# Patient Record
Sex: Female | Born: 1995 | Hispanic: Yes | Marital: Married | Smoking: Former smoker
Health system: Southern US, Community
[De-identification: ages and names within clinical notes are randomized; demographics above are authoritative.]

## PROBLEM LIST (undated history)

## (undated) DIAGNOSIS — N159 Renal tubulo-interstitial disease, unspecified: Secondary | ICD-10-CM

## (undated) DIAGNOSIS — Z789 Other specified health status: Secondary | ICD-10-CM

## (undated) HISTORY — DX: Renal tubulo-interstitial disease, unspecified: N15.9

## (undated) HISTORY — PX: OTHER SURGICAL HISTORY: SHX169

## (undated) HISTORY — PX: NO PAST SURGERIES: SHX2092

---
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
## 2016-10-25 ENCOUNTER — Emergency Department
Admission: EM | Admit: 2016-10-25 | Discharge: 2016-10-25 | Disposition: A | Discharge status: Home / Self Care | Payer: No Typology Code available for payment source | Attending: Emergency Medicine | Admitting: Emergency Medicine

## 2016-10-25 ENCOUNTER — Emergency Department: Payer: No Typology Code available for payment source

## 2016-10-25 DIAGNOSIS — S161XXA Strain of muscle, fascia and tendon at neck level, initial encounter: Secondary | ICD-10-CM | POA: Insufficient documentation

## 2016-10-25 DIAGNOSIS — Y939 Activity, unspecified: Secondary | ICD-10-CM | POA: Diagnosis not present

## 2016-10-25 DIAGNOSIS — Y929 Unspecified place or not applicable: Secondary | ICD-10-CM | POA: Diagnosis not present

## 2016-10-25 DIAGNOSIS — S7010XA Contusion of unspecified thigh, initial encounter: Secondary | ICD-10-CM

## 2016-10-25 DIAGNOSIS — M25552 Pain in left hip: Secondary | ICD-10-CM | POA: Insufficient documentation

## 2016-10-25 DIAGNOSIS — M25551 Pain in right hip: Secondary | ICD-10-CM | POA: Diagnosis not present

## 2016-10-25 DIAGNOSIS — S79919A Unspecified injury of unspecified hip, initial encounter: Secondary | ICD-10-CM | POA: Diagnosis present

## 2016-10-25 DIAGNOSIS — Y999 Unspecified external cause status: Secondary | ICD-10-CM | POA: Diagnosis not present

## 2016-10-25 DIAGNOSIS — S7000XA Contusion of unspecified hip, initial encounter: Secondary | ICD-10-CM

## 2016-10-25 LAB — POCT PREGNANCY, URINE: Preg Test, Ur: NEGATIVE

## 2016-10-25 MED ORDER — OXYCODONE-ACETAMINOPHEN 5-325 MG PO TABS: 1.0000 | ORAL_TABLET | Freq: Four times a day (QID) | ORAL | 0 refills | Status: AC | PRN

## 2016-10-25 MED ORDER — MORPHINE SULFATE (PF) 4 MG/ML IV SOLN
4.0000 mg | Freq: Once | INTRAVENOUS | Status: AC
  Administered 2016-10-25: 4 mg via INTRAVENOUS
  Filled 2016-10-25: qty 1

## 2016-10-25 MED ORDER — CYCLOBENZAPRINE HCL 5 MG PO TABS: 5.0000 mg | ORAL_TABLET | Freq: Three times a day (TID) | ORAL | 0 refills | Status: DC | PRN

## 2016-10-25 MED ORDER — ONDANSETRON HCL 4 MG/2ML IJ SOLN
INTRAMUSCULAR | Status: AC
  Administered 2016-10-25: 4 mg via INTRAVENOUS
  Filled 2016-10-25: qty 2

## 2016-10-25 MED ORDER — ONDANSETRON HCL 4 MG/2ML IJ SOLN
4.0000 mg | Freq: Once | INTRAMUSCULAR | Status: AC
  Administered 2016-10-25: 4 mg via INTRAVENOUS
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
## 2016-10-25 NOTE — Discharge Instructions (Addendum)
Take medication as prescribed. Return to emergency department if symptoms worsen and follow-up with PCP as needed.    As symptoms improve you may transition to over-the-counter ibuprofen, Advil, Motrin or Aleve as directed on the medication bottle. You may also use cold or heat for symptom management.

## 2017-01-09 ENCOUNTER — Emergency Department: Payer: Self-pay

## 2017-01-09 DIAGNOSIS — Z3A01 Less than 8 weeks gestation of pregnancy: Secondary | ICD-10-CM | POA: Insufficient documentation

## 2017-01-09 DIAGNOSIS — R1031 Right lower quadrant pain: Secondary | ICD-10-CM | POA: Insufficient documentation

## 2017-01-09 DIAGNOSIS — O9989 Other specified diseases and conditions complicating pregnancy, childbirth and the puerperium: Secondary | ICD-10-CM | POA: Insufficient documentation

## 2017-01-09 LAB — URINALYSIS, COMPLETE (UACMP) WITH MICROSCOPIC
Bilirubin Urine: NEGATIVE
GLUCOSE, UA: NEGATIVE mg/dL
HGB URINE DIPSTICK: NEGATIVE
Ketones, ur: NEGATIVE mg/dL
Leukocytes, UA: NEGATIVE
Nitrite: NEGATIVE
Protein, ur: NEGATIVE mg/dL
SPECIFIC GRAVITY, URINE: 1.002 — AB (ref 1.005–1.030)
pH: 6 (ref 5.0–8.0)

## 2017-01-09 LAB — CBC
HEMATOCRIT: 35 % (ref 35.0–47.0)
HEMOGLOBIN: 12 g/dL (ref 12.0–16.0)
MCH: 31.2 pg (ref 26.0–34.0)
MCHC: 34.1 g/dL (ref 32.0–36.0)
MCV: 91.4 fL (ref 80.0–100.0)
Platelets: 279 10*3/uL (ref 150–440)
RBC: 3.83 MIL/uL (ref 3.80–5.20)
RDW: 12.3 % (ref 11.5–14.5)
WBC: 7.4 10*3/uL (ref 3.6–11.0)

## 2017-01-09 LAB — COMPREHENSIVE METABOLIC PANEL
ALK PHOS: 57 U/L (ref 38–126)
ALT: 23 U/L (ref 14–54)
ANION GAP: 7 (ref 5–15)
AST: 22 U/L (ref 15–41)
Albumin: 4.3 g/dL (ref 3.5–5.0)
BILIRUBIN TOTAL: 0.7 mg/dL (ref 0.3–1.2)
BUN: 9 mg/dL (ref 6–20)
CALCIUM: 9.3 mg/dL (ref 8.9–10.3)
CO2: 24 mmol/L (ref 22–32)
Chloride: 103 mmol/L (ref 101–111)
Creatinine, Ser: 0.46 mg/dL (ref 0.44–1.00)
GFR calc Af Amer: >60 mL/min (ref >60)
GFR calc non Af Amer: >60 mL/min (ref >60)
Glucose, Bld: 88 mg/dL (ref 65–99)
POTASSIUM: 4.1 mmol/L (ref 3.5–5.1)
SODIUM: 134 mmol/L — AB (ref 135–145)
TOTAL PROTEIN: 7.5 g/dL (ref 6.5–8.1)

## 2017-01-09 LAB — HCG, QUANTITATIVE, PREGNANCY: HCG, BETA CHAIN, QUANT, S: 7075 m[IU]/mL — AB (ref <5)

## 2017-01-09 LAB — POCT PREGNANCY, URINE: PREG TEST UR: POSITIVE — AB

## 2017-01-09 LAB — ABO/RH: ABO/RH(D): O POS

## 2017-01-09 NOTE — ED Triage Notes (Signed)
Patient c/o intermittent LRQ abdominal pain beginning 3 weeks ago with increasing severity and frequency yesterday and today. Patient reports she is [redacted] weeks pregnant with her first child. Patient has not seen an OB yet with this pregnancy - [facility] confirmed [facility]. Patient denies bleeding/vaginal discharge. Patient c/o dysuria and urinary frequency.

## 2017-01-10 ENCOUNTER — Emergency Department
Admission: EM | Admit: 2017-01-10 | Discharge: 2017-01-10 | Disposition: A | Discharge status: Home / Self Care | Payer: Self-pay | Attending: Emergency Medicine | Admitting: Emergency Medicine

## 2017-01-10 DIAGNOSIS — O26891 Other specified pregnancy related conditions, first trimester: Secondary | ICD-10-CM

## 2017-01-10 DIAGNOSIS — R109 Unspecified abdominal pain: Secondary | ICD-10-CM

## 2017-01-10 NOTE — ED Provider Notes (Signed)
[facility] Emergency Department Provider Note  ____________________________________________   First MD Initiated Contact with Patient 01/10/17 [phone]     (approximate)  I have reviewed the triage vital signs and the nursing notes.   HISTORY  Chief Complaint Abdominal Pain (LRQ)   HPI Ana Ortiz is a 21 y.o. female who self presents to the emergency department with roughly 3 weeks of intermittent right lower quadrant pain.  She is roughly [redacted] weeks pregnant with her first and desired pregnancy.  [facility]  Her pain is mild to moderate intermittent lasting 15 minutes or so at a time and then recurring on its own.  Nothing in particular seems to make it better or worse.  She denies vaginal bleeding or discharge.  History reviewed. No pertinent past medical history.  There are no active problems to display for this patient.   History reviewed. No pertinent surgical history.  Prior to Admission medications   Medication Sig Start Date End Date Taking? Authorizing Provider  cyclobenzaprine (FLEXERIL) 5 MG tablet Take 1 tablet (5 mg total) by mouth 3 (three) times daily as needed. 10/25/16   Little, Traci M, PA-C  oxyCODONE-acetaminophen (ROXICET) 5-325 MG tablet Take 1 tablet by mouth every 6 (six) hours as needed. 10/25/16 10/25/17  Little, Jordan Likesraci M, PA-C    Allergies Patient has no known allergies.  No family history on file.  Social History Social History   Tobacco Use  . Smoking status: Never Smoker  . Smokeless tobacco: Never Used  Substance Use Topics  . Alcohol use: No  . Drug use: Not on file    Review of Systems Constitutional: No fever/chills Eyes: No visual changes. ENT: No sore throat. Cardiovascular: Denies chest pain. Respiratory: Denies shortness of breath. Gastrointestinal: Positive for abdominal pain.  No nausea, no vomiting.  No diarrhea.  No constipation. Genitourinary: Negative for  dysuria. Musculoskeletal: Negative for back pain. Skin: Negative for rash. Neurological: Negative for headaches, focal weakness or numbness.   ____________________________________________   PHYSICAL EXAM:  VITAL SIGNS: ED Triage Vitals  Enc Vitals Group     BP 01/09/17 2217 125/77     Pulse Rate 01/09/17 2217 85     Resp 01/09/17 2217 18     Temp 01/09/17 2217 98.7 F (37.1 C)     Temp Source 01/09/17 2217 Oral     SpO2 01/09/17 2217 100 %     Weight 01/09/17 2220 155 lb (70.3 kg)     Height --      Head Circumference --      Peak Flow --      Pain Score 01/09/17 2220 10     Pain Loc --      Pain Edu? --      Excl. in GC? --     Constitutional: Alert and oriented x4 well-appearing nontoxic no diaphoresis speaks full clear sentences Eyes: PERRL EOMI. Head: Atraumatic. Nose: No congestion/rhinnorhea. Mouth/Throat: No trismus Neck: No stridor.   Cardiovascular: Normal rate, regular rhythm. Grossly normal heart sounds.  Good peripheral circulation. Respiratory: Normal respiratory effort.  No retractions. Lungs CTAB and moving good air Gastrointestinal: Soft nondistended mild lower abdominal tenderness with no rebound or guarding no peritonitis no focality.  No McBurney's tenderness today Rovsing's no costovertebral tenderness Musculoskeletal: No lower extremity edema   Neurologic:  Normal speech and language. No gross focal neurologic deficits are appreciated. Skin:  Skin is warm, dry and intact. No rash noted.  Psychiatric: Mood and affect are normal. Speech and behavior are normal.    ____________________________________________   DIFFERENTIAL includes but not limited to  Ectopic pregnancy, appendicitis, diverticulitis, ovarian torsion, ruptured cyst ____________________________________________   LABS (all labs ordered are listed, but only abnormal results are displayed)  Labs Reviewed  COMPREHENSIVE METABOLIC PANEL - Abnormal; Notable for the following  components:      Result Value   Sodium 134 (*)    All other components within normal limits  URINALYSIS, COMPLETE (UACMP) WITH MICROSCOPIC - Abnormal; Notable for the following components:   Color, Urine STRAW (*)    APPearance CLEAR (*)    Specific Gravity, Urine 1.002 (*)    Bacteria, UA RARE (*)    Squamous Epithelial / LPF 0-5 (*)    All other components within normal limits  HCG, QUANTITATIVE, PREGNANCY - Abnormal; Notable for the following components:   hCG, Beta Chain, Quant, S 7,075 (*)    All other components within normal limits  POCT PREGNANCY, URINE - Abnormal; Notable for the following components:   Preg Test, Ur POSITIVE (*)    All other components within normal limits  CBC  POC URINE PREG, ED  ABO/RH    Blood work reviewed by me shows that she is __________________________________________  EKG   ____________________________________________  RADIOLOGY  Pelvic ultrasound reviewed by me shows intrauterine pregnancy ____________________________________________   PROCEDURES  Procedure(s) performed: no  Procedures  Critical Care performed: no  Observation: no ____________________________________________   INITIAL IMPRESSION / ASSESSMENT AND PLAN / ED COURSE  Pertinent labs & imaging results that were available during my care of the patient were reviewed by me and considered in my medical decision making (see chart for details).  Patient is very well-appearing with benign abdominal exam in 3 weeks of symptoms.  She is not on fertility medications.  Her ultrasound shows an intrauterine pregnancy.  I have encouraged her to establish care with primary care in the next 2-3 days for a beta recheck on the off chance this could be a heterotopic pregnancy.  She is discharged home in improved condition verbalized understanding and agree with plan.      ____________________________________________   FINAL CLINICAL IMPRESSION(S) / ED DIAGNOSES  Final  diagnoses:  Abdominal pain during pregnancy in first trimester      NEW MEDICATIONS STARTED DURING THIS VISIT:  This SmartLink is deprecated. Use AVSMEDLIST instead to display the medication list for a patient.   Note:  This document was prepared using Dragon voice recognition software and may include unintentional dictation errors.     Merrily Brittleifenbark, Chiamaka Latka, MD 01/11/17 [phone]

## 2017-01-10 NOTE — ED Notes (Signed)
Pt states left lower sided intermittent abdominal cramping. denies bleeding or discharge. Pt reports being approx [redacted] weeks pregnant with first pregnancy

## 2017-01-10 NOTE — ED Notes (Signed)

## 2017-01-10 NOTE — Discharge Instructions (Signed)
[facility] for [facility].  Return to the emergency department sooner for any concerns whatsoever.  It was a pleasure to take care of you today, and thank you for coming to our emergency department.  If you have any questions or concerns before leaving please ask the nurse to grab me and I'm more than happy to go through your aftercare instructions again.  If you were prescribed any opioid pain medication today such as Norco, Vicodin, Percocet, morphine, hydrocodone, or oxycodone please make sure you do not drive when you are taking this medication as it can alter your ability to drive safely.  If you have any concerns once you are home that you are not improving or are in fact getting worse before you can make it to your follow-up appointment, please do not hesitate to call 911 and come back for further evaluation.  Merrily BrittleNeil Abdur Hoglund, MD  Results for orders placed or performed during the hospital encounter of 01/10/17  Comprehensive metabolic panel  Result Value Ref Range   Sodium 134 (L) 135 - 145 mmol/L   Potassium 4.1 3.5 - 5.1 mmol/L   Chloride 103 101 - 111 mmol/L   CO2 24 22 - 32 mmol/L   Glucose, Bld 88 65 - 99 mg/dL   BUN 9 6 - 20 mg/dL   Creatinine, Ser 4.540.46 0.44 - 1.00 mg/dL   Calcium 9.3 8.9 - 09.810.3 mg/dL   Total Protein 7.5 6.5 - 8.1 g/dL   Albumin 4.3 3.5 - 5.0 g/dL   AST 22 15 - 41 U/L   ALT 23 14 - 54 U/L   Alkaline Phosphatase 57 38 - 126 U/L   Total Bilirubin 0.7 0.3 - 1.2 mg/dL   GFR calc non Af Amer >60 >60 mL/min   GFR calc Af Amer >60 >60 mL/min   Anion gap 7 5 - 15  CBC  Result Value Ref Range   WBC 7.4 3.6 - 11.0 K/uL   RBC 3.83 3.80 - 5.20 MIL/uL   Hemoglobin 12.0 12.0 - 16.0 g/dL   HCT 11.935.0 14.735.0 - 82.947.0 %   MCV 91.4 80.0 - 100.0 fL   MCH 31.2 26.0 - 34.0 pg   MCHC 34.1 32.0 - 36.0 g/dL   RDW 56.212.3 13.011.5 - 86.514.5 %   Platelets 279 150 - 440 K/uL  Urinalysis, Complete w Microscopic  Result Value Ref Range   Color, Urine  STRAW (A) YELLOW   APPearance CLEAR (A) CLEAR   Specific Gravity, Urine 1.002 (L) 1.005 - 1.030   pH 6.0 5.0 - 8.0   Glucose, UA NEGATIVE NEGATIVE mg/dL   Hgb urine dipstick NEGATIVE NEGATIVE   Bilirubin Urine NEGATIVE NEGATIVE   Ketones, ur NEGATIVE NEGATIVE mg/dL   Protein, ur NEGATIVE NEGATIVE mg/dL   Nitrite NEGATIVE NEGATIVE   Leukocytes, UA NEGATIVE NEGATIVE   RBC / HPF 0-5 0 - 5 RBC/hpf   WBC, UA 0-5 0 - 5 WBC/hpf   Bacteria, UA RARE (A) NONE SEEN   Squamous Epithelial / LPF 0-5 (A) NONE SEEN  hCG, quantitative, pregnancy  Result Value Ref Range   hCG, Beta Chain, Quant, S 7,075 (H) <5 mIU/mL  Pregnancy, urine POC  Result Value Ref Range   Preg Test, Ur POSITIVE (A) NEGATIVE  ABO/Rh  Result Value Ref Range   ABO/RH(D) O POS    [place] Ob Comp Less 14 Wks  Result Date: 01/10/2017 CLINICAL DATA:  Intermittent right lower quadrant pain for 3 weeks.  Quantitative HCG 7,075 EXAM: OBSTETRIC <14 WK US AND TRANSVAGINAL OB US TECHNIQUE: Both transabdominal and transvaginal ultrasound examinations were performed for complete evaluation of the gestation as well as the maternal uterus, adnexal regions, and pelvic cul-de-sac. Transvaginal technique was performed to assess early pregnancy. COMPARISON:  None. FINDINGS: Intrauterine gestational sac: Single Yolk sac:  Not visible Embryo:  Not visible Cardiac Activity: Not visible Heart Rate:   bpm MSD: 9  mm   5 w   5  d CRL:    mm    w    d                  US EDC: Subchorionic hemorrhage:  None visualized. Maternal uterus/adnexae: Small volume free pelvic fluid. No adnexal masses. IMPRESSION: Probable early intrauterine gestational sac, but no yolk sac, fetal pole, or cardiac activity yet visualized. Recommend follow-up quantitative B-HCG levels and follow-up US in 14 days to assess viability. This recommendation follows SRU consensus guidelines: Diagnostic Criteria for Nonviable Pregnancy Early in the First Trimester. Malva Limes Engl J Med 2013; 161:0960-45; 369:1443-51.  Electronically Signed   By: Ellery Plunkaniel R Mitchell M.D.   On: 01/10/2017 01:19   [place] Ob Transvaginal  Result Date: 01/10/2017 CLINICAL DATA:  Intermittent right lower quadrant pain for 3 weeks. Quantitative HCG 7,075 EXAM: OBSTETRIC <14 WK US AND TRANSVAGINAL OB US TECHNIQUE: Both transabdominal and transvaginal ultrasound examinations were performed for complete evaluation of the gestation as well as the maternal uterus, adnexal regions, and pelvic cul-de-sac. Transvaginal technique was performed to assess early pregnancy. COMPARISON:  None. FINDINGS: Intrauterine gestational sac: Single Yolk sac:  Not visible Embryo:  Not visible Cardiac Activity: Not visible Heart Rate:   bpm MSD: 9  mm   5 w   5  d CRL:    mm    w    d                  US EDC: Subchorionic hemorrhage:  None visualized. Maternal uterus/adnexae: Small volume free pelvic fluid. No adnexal masses. IMPRESSION: Probable early intrauterine gestational sac, but no yolk sac, fetal pole, or cardiac activity yet visualized. Recommend follow-up quantitative B-HCG levels and follow-up US in 14 days to assess viability. This recommendation follows SRU consensus guidelines: Diagnostic Criteria for Nonviable Pregnancy Early in the First Trimester. Malva Limes Engl J Med 2013; 409:8119-14; 369:1443-51. Electronically Signed   By: Ellery Plunkaniel R Mitchell M.D.   On: 01/10/2017 01:19

## 2017-01-14 ENCOUNTER — Encounter: Payer: Self-pay | Admitting: Obstetrics and Gynecology

## 2017-02-10 ENCOUNTER — Other Ambulatory Visit: Payer: Self-pay | Admitting: Nurse Practitioner

## 2017-02-10 DIAGNOSIS — Z369 Encounter for antenatal screening, unspecified: Secondary | ICD-10-CM

## 2017-02-10 LAB — SICKLE CELL SCREEN: Sickle Cell Screen: NORMAL

## 2017-02-24 ENCOUNTER — Ambulatory Visit (HOSPITAL_BASED_OUTPATIENT_CLINIC_OR_DEPARTMENT_OTHER)
Admission: RE | Admit: 2017-02-24 | Discharge: 2017-02-24 | Disposition: A | Discharge status: Home / Self Care | Payer: BLUE CROSS/BLUE SHIELD | Source: Ambulatory Visit | Attending: Obstetrics and Gynecology | Admitting: Obstetrics and Gynecology

## 2017-02-24 ENCOUNTER — Ambulatory Visit
Admission: RE | Admit: 2017-02-24 | Discharge: 2017-02-24 | Disposition: A | Discharge status: Home / Self Care | Payer: BLUE CROSS/BLUE SHIELD | Source: Ambulatory Visit | Attending: Obstetrics and Gynecology | Admitting: Obstetrics and Gynecology

## 2017-02-24 VITALS — BP 125/60 | HR 89 | Temp 98.5°F | Resp 18 | Ht 65.0 in | Wt 166.0 lb

## 2017-02-24 DIAGNOSIS — Z369 Encounter for antenatal screening, unspecified: Secondary | ICD-10-CM

## 2017-02-24 DIAGNOSIS — Z3A12 12 weeks gestation of pregnancy: Secondary | ICD-10-CM | POA: Insufficient documentation

## 2017-02-24 DIAGNOSIS — Z363 Encounter for antenatal screening for malformations: Secondary | ICD-10-CM

## 2017-02-24 HISTORY — DX: Other specified health status: Z78.9

## 2017-02-24 NOTE — Progress Notes (Addendum)
Referring physician:  [facility] Department Length of Consultation: 40 minutes   Ms. Ana Ortiz  was referred to [facility] for genetic counseling to review prenatal screening and testing options.  This note summarizes the information we discussed.  The patient was counseled by Lonie Peak, genetic counseling intern, supervised by Katrina Stack, MS, CGC.  We offered the following routine screening tests for this pregnancy:  First trimester screening, which includes nuchal translucency ultrasound screen and first trimester maternal serum marker screening.  The nuchal translucency has approximately an 80% detection rate for Down syndrome and can be positive for other chromosome abnormalities as well as congenital heart defects.  When combined with a maternal serum marker screening, the detection rate is up to 90% for Down syndrome and up to 97% for trisomy 18.     Maternal serum marker screening, a blood test that measures pregnancy proteins, can provide risk assessments for Down syndrome, trisomy 18, and open neural tube defects (spina bifida, anencephaly). Because it does not directly examine the fetus, it cannot positively diagnose or rule out these problems.  Targeted ultrasound uses high frequency sound waves to create an image of the developing fetus.  An ultrasound is often recommended as a routine means of evaluating the pregnancy.  It is also used to screen for fetal anatomy problems (for example, a heart defect) that might be suggestive of a chromosomal or other abnormality.   Should these screening tests indicate an increased concern, then the following additional testing options would be offered:  The chorionic villus sampling procedure is available for first trimester chromosome analysis.  This involves the withdrawal of a small amount of chorionic villi (tissue from the developing placenta).  Risk of pregnancy loss is estimated to be approximately 1  in 200 to 1 in 100 (0.5 to 1%).  There is approximately a 1% (1 in 100) chance that the CVS chromosome results will be unclear.  Chorionic villi cannot be tested for neural tube defects.     Amniocentesis involves the removal of a small amount of amniotic fluid from the sac surrounding the fetus with the use of a thin needle inserted through the maternal abdomen and uterus.  Ultrasound guidance is used throughout the procedure.  Fetal cells from amniotic fluid are directly evaluated and > 99.5% of chromosome problems and > 98% of open neural tube defects can be detected. This procedure is generally performed after the 15th week of pregnancy.  The main risks to this procedure include complications leading to miscarriage in less than 1 in 200 cases (0.5%).  As another option for information if the pregnancy is suspected to be an an increased chance for certain chromosome conditions, we also reviewed the availability of cell free fetal DNA testing from maternal blood to determine whether or not the baby may have either Down syndrome, trisomy 8, or trisomy 23.  This test utilizes a maternal blood sample and DNA sequencing technology to isolate circulating cell free fetal DNA from maternal plasma.  The fetal DNA can then be analyzed for DNA sequences that are derived from the three most common chromosomes involved in aneuploidy, chromosomes 13, 18, and 21.  If the overall amount of DNA is greater than the expected level for any of these chromosomes, aneuploidy is suspected.  While we do not consider it a replacement for invasive testing and karyotype analysis, a negative result from this testing would be reassuring, though not a guarantee of a normal chromosome  complement for the baby.  An abnormal result is certainly suggestive of an abnormal chromosome complement, though we would still recommend CVS or amniocentesis to confirm any findings from this testing.  Cystic Fibrosis and Spinal Muscular Atrophy (SMA)  screening were also discussed with the patient. Both conditions are recessive, which means that both parents must be carriers in order to have a child with the disease.  Cystic fibrosis (CF) is one of the most common genetic conditions in persons of Caucasian ancestry.  This condition occurs in approximately 1 in 2,500 Caucasian persons and results in thickened secretions in the lungs, digestive, and reproductive systems.  For a baby to be at risk for having CF, both of the parents must be carriers for this condition.  Approximately 1 in 6825 Caucasian persons is a carrier for CF.  Current carrier testing looks for the most common mutations in the gene for CF and can detect approximately 90% of carriers in the Caucasian population.  This means that the carrier screening can greatly reduce, but cannot eliminate, the chance for an individual to have a child with CF.  If an individual is found to be a carrier for CF, then carrier testing would be available for the partner. As part of Ana Ortiz's newborn screening profile, all babies born in the state of [place] will have a two-tier screening process.  Specimens are first tested to determine the concentration of immunoreactive trypsinogen (IRT).  The top 5% of specimens with the highest IRT values then undergo DNA testing using a panel of over 40 common CF mutations. SMA is a neurodegenerative disorder that leads to atrophy of skeletal muscle and overall weakness.  This condition is also more prevalent in the Caucasian population, with 1 in 40-1 in 60 persons being a carrier and 1 in 6,000-1 in 10,000 children being affected.  There are multiple forms of the disease, with some causing death in infancy to other forms with survival into adulthood.  The genetics of SMA is complex, but carrier screening can detect up to 95% of carriers in the Caucasian population.  Similar to CF, a negative result can greatly reduce, but cannot eliminate, the chance to have a  child with SMA.  We elicited a detailed family history and pregnancy history. The patient's maternal half sister had unspecified abdominal surgery during pregnancy and gave birth to a daughter who was 3 months premature. This niece began missing speech milestone at age 661.21 years old and has trouble moving the right side of her body. The patient was not sure of the cause for the condition or if a specific diagnosis is known.  We reviewed that there may be various reasons for developmental differences like she described but that without additional medical information, it is difficult to determine the level of concern for this pregnancy.  The FOB's mother had 2 miscarriages and lost a daughter at age 587 months due to unspecified fever. She has two healthy sons. Three maternal aunts of the FOB had recurrent miscarriages or stillbirths or preterm births resulting in demise, though it was difficult to differentiate these. One maternal aunt has one living child and had multiple miscarriages/stillbirths/preterm births. A second maternal aunt has 3 living children after bed rest and had 3 miscarriages/stillbirths/preterm births. A third maternal aunt has no living children and had multiple miscarriages/stillbirths/preterm births. We reviewed that recurrent pregnancy loss or preterm birth can occur for many reasons including genetic conditions, chromosome rearrangements, maternal obstetric conditions or other causes.  Again, additional information is needed to assess the concern.  FOB's father died at 67 years old due to unspecified illness. The remainder of the family history is unremarkable for birth defects, intellectual disability, recurrent pregnancy loss, or known chromosome abnormalities.  Ms. Ana Ortiz stated that this is her first pregnancy.  She reported no complications or exposures that would be expected to increase the risk for birth defects.  After consideration of the options, Ms. Ana Ortiz elected to have an  ultrasound and decline all other screening and testing options.  An ultrasound was performed at the time of the visit.  The gestational age was consistent with  12 weeks.  Fetal anatomy could not be assessed due to early gestational age.  Please refer to the ultrasound report for details of that study.  Ms, Ana Ortiz was scheduled to return for anatomy ultrasound in 6 weeks.  Ms. Ana Ortiz was encouraged to call with questions or concerns.  We can be contacted at [phone].    Cherly Anderson, MS, CGC  Katrina Stack, MS, CGC performed an integral service incident to the physician's initial service.  I was physically present in the clinical area and was immediately available to render assistance.   Lanier Felty C Signe Tackitt

## 2017-04-10 ENCOUNTER — Other Ambulatory Visit: Payer: BLUE CROSS/BLUE SHIELD

## 2017-06-13 LAB — HM HIV SCREENING LAB: HM HIV Screening: NEGATIVE

## 2017-10-27 LAB — HM PAP SMEAR: HM Pap smear: NEGATIVE

## 2017-11-27 ENCOUNTER — Encounter (HOSPITAL_COMMUNITY): Payer: Self-pay

## 2019-06-15 ENCOUNTER — Telehealth: Payer: Self-pay | Admitting: Family Medicine

## 2019-06-15 NOTE — Telephone Encounter (Signed)
Patient wants to talk to provider/nurse about her [facility].

## 2019-06-15 NOTE — Telephone Encounter (Signed)
Patient returned phone call. Wants IUD removed and switch to OCP's. Advised patient she would also need a RP. Patient verbalized understanding. RP and IUD removal appt scheduled for 06/21/19 @ 9:00. Instructed patient to arrive at 8:30 for check in. Tawny Hopping, RN

## 2019-06-15 NOTE — Telephone Encounter (Signed)
Returned patient phone call to provided number. No answer, LMTC. Elmor Kost, RN  

## 2019-06-21 ENCOUNTER — Encounter: Payer: Self-pay | Admitting: Family Medicine

## 2019-06-21 ENCOUNTER — Other Ambulatory Visit: Payer: Self-pay

## 2019-06-21 ENCOUNTER — Ambulatory Visit (LOCAL_COMMUNITY_HEALTH_CENTER): Payer: BC Managed Care – PPO | Admitting: Family Medicine

## 2019-06-21 VITALS — BP 117/82 | Wt 157.8 lb

## 2019-06-21 DIAGNOSIS — Z30432 Encounter for removal of intrauterine contraceptive device: Secondary | ICD-10-CM

## 2019-06-21 DIAGNOSIS — Z3009 Encounter for other general counseling and advice on contraception: Secondary | ICD-10-CM | POA: Diagnosis not present

## 2019-06-21 MED ORDER — NORETHIN ACE-ETH ESTRAD-FE 1-20 MG-MCG PO TABS: 1.0000 | ORAL_TABLET | Freq: Every day | ORAL | 3 refills | Status: DC

## 2019-06-21 NOTE — Progress Notes (Signed)
In for IUD removal & considering O.C.'s; declines HIV/RPR testing Sharlette Dense, RN PCP list given Sharlette Dense, RN

## 2019-06-21 NOTE — Progress Notes (Signed)
Family Planning Visit- Initial Visit  Subjective:  Ana Ortiz is a 24 y.o.  G1P0  being seen today for an initial well woman visit and to discuss family planning options. Patient reports they do want a pregnancy in the next year.   Chief Complaint  Patient presents with  . Contraception    Pt has First trimester screening on their problem list.   HPI  Patient reports she would like her IUD removed, is desiring pregnancy w/in the year but would like OCP until ready.   Pt does not meet any of the following contraindications to estrogen use: -Age ?35 years and smoking ?15 cigarettes per day -Migraine with aura -Two or more RF for arterial CVD (such as older age, smoking, diabetes, and hypertension) -HTN -Breast cancer -VTE hx or acute event -Known thrombogenic mutations -Known ischemic heart disease -History of stroke -Complicated valvular heart disease (pulmonary HTN, risk for afib, hx subacute bacterial endocarditis) -Cirrhosis, Hepatocellular adenoma or malignant hepatoma    No LMP recorded. (Menstrual status: IUD). BCM: IUD Pt desires EC? n/a  Last pap: 10/27/2017 = neg  Last breast exam: never, declines today Personal/family hx breast cancer? no  Patient reports 1 partner(s) in last year. Do they desire STI screening (if no, why not)? No, declines  Does the patient desire a pregnancy in the next year? yes   24 y.o., Body mass index is 27.09 kg/m. - Is patient eligible for HA1C diabetes screening based on BMI and age >59?  no  Has patient been screened once for HCV in the past?  no  No results found for: HCVAB  Does the patient have current of drug use, have a partner with drug use, and/or has been incarcerated since last result? no If yes-- Screen for HCV through [facility]   Does the patient meet criteria for HBV testing? no  Criteria:  -Household, sexual or needle sharing contact with HBV -History of drug use -HIV positive -Those with known Hep  C  See flowsheet for other program required questions.   Health Maintenance Due  Topic Date Due  . COVID-19 Vaccine (1) Never done  . CHLAMYDIA SCREENING  Never done    ROS 10 point review of systems is otherwise negative except as mentioned in HPI and listed below: Easy bruising: past few months Weight gain: taking phentermine HA: describes as dull, possibly d/t lack of sleep per pt, heat  The following portions of the patient's history were reviewed and updated as appropriate: allergies, current medications, past family history, past medical history, past social history, past surgical history and problem list. Problem list updated.   See flowsheet for other program required questions.  Objective:   Vitals:   06/21/19 0857  BP: 117/82  Weight: 157 lb 12.8 oz (71.6 kg)    Physical Exam Vitals and nursing note reviewed.  Constitutional:      Appearance: Normal appearance.  HENT:     Head: Normocephalic and atraumatic.     Mouth/Throat:     Mouth: Mucous membranes are moist.     Pharynx: Oropharynx is clear. No oropharyngeal exudate or posterior oropharyngeal erythema.  Eyes:     Conjunctiva/sclera: Conjunctivae normal.  Neck:     Thyroid: No thyroid mass, thyromegaly or thyroid tenderness.  Cardiovascular:     Rate and Rhythm: Normal rate and regular rhythm.     Pulses: Normal pulses.     Heart sounds: Normal heart sounds.  Pulmonary:     Effort: Pulmonary  effort is normal.     Breath sounds: Normal breath sounds.  Chest: pt declines Abdominal:     General: Abdomen is flat.     Palpations: There is no mass.     Tenderness: There is no abdominal tenderness. There is no rebound.  Genitourinary:     General: Normal vulva.     Exam position: Lithotomy position.     Pubic Area: No rash or pubic lice.      Labia:        Right: No rash or lesion.        Left: No rash or lesion.      Vagina: Normal. No vaginal discharge, erythema, bleeding or lesions.     Cervix:  No cervical motion tenderness, discharge, friability, lesion or erythema.     Uterus: Normal.      Adnexa: Right adnexa normal and left adnexa normal.     Rectum: Normal.  Lymphadenopathy:     Head:     Right side of head: No preauricular or posterior auricular adenopathy.     Left side of head: No preauricular or posterior auricular adenopathy.     Cervical: No cervical adenopathy.     Upper Body:     Right upper body: No supraclavicular or axillary adenopathy.     Left upper body: No supraclavicular or axillary adenopathy.     Lower Body: No right inguinal adenopathy. No left inguinal adenopathy.  Skin:    General: Skin is warm and dry.     Findings: No rash.  Neurological:     Mental Status: She is alert and oriented to person, place, and time.     IUD Removal  Patient identified, informed consent performed, consent signed.  Patient was in the dorsal lithotomy position, normal external genitalia was noted.  A speculum was placed in the patient's vagina, normal discharge was noted, no lesions. The cervix was visualized, no lesions, no abnormal discharge.  The strings of the IUD were grasped and pulled using ring forceps. The IUD was removed in its entirety. Patient tolerated the procedure well.    Patient will use OCP for contraception until her planned pregnancy. She was told to avoid teratogens, take PNV and folic acid.  Routine preventative health maintenance measures emphasized.    Assessment and Plan:  Ana Ortiz is a 24 y.o. female presenting to the [facility] Department for an initial well woman exam/family planning visit.  Contraception counseling: Reviewed all forms of birth control options in the tiered based approach. available including abstinence; over the counter/barrier methods; hormonal contraceptive medication including pill, patch, ring, injection,contraceptive implant, ECP; hormonal and nonhormonal IUDs; permanent sterilization options including  vasectomy and the various tubal sterilization modalities. Risks, benefits, and typical effectiveness rates were reviewed.  Questions were answered.  Written information was also given to the patient to review.  Patient desires OCP, this was prescribed for patient. She will follow up in  1 year for surveillance.  She was told to call with any further questions, or with any concerns about this method of contraception.  Emphasized use of condoms 100% of the time for STI prevention.  Emergency Contraception:n/a  1. Family planning services -IUD removed today -OCP rx x1 yr -Pap: due in 1 year -CBE: pt declines today. Recommended screening mammograms beginning at age 52 -STI screen: pt declines -Recommended PCP f/u for easy bruising, HA. PCP handout given. -Preconception counseling today: -Advised to continue taking PNV w/folic acid.  -Encouraged regular exercise and  healthy diet w/plenty of fruits and vegetables.  -We reviewed their current problems and medications in terms of pregnancy safety. Advised to d/c phentermine when she stops OCPs. -We discussed fertility awareness, when to take a pregnancy test, and general early pregnancy precautions.   -Reviewed services at the office regarding prenatal care.  - norethindrone-ethinyl estradiol (MICROGESTIN FE 1/20) 1-20 MG-MCG tablet; Take 1 tablet by mouth daily.  Dispense: 3 Package; Refill: 3   No follow-ups on file.  No future appointments.  Ann Held, PA-C

## 2019-07-01 IMAGING — US US OB TRANSVAGINAL
1 series · 14 of 28 positions shown · non-contrast
Comparison: None.

CLINICAL DATA: Intermittent right lower quadrant pain for 3 weeks.
Quantitative HCG 7,075

EXAM:
OBSTETRIC <14 WK US AND TRANSVAGINAL OB US
TECHNIQUE: Both transabdominal and transvaginal ultrasound examinations were
performed for complete evaluation of the gestation as well as the
maternal uterus, adnexal regions, and pelvic cul-de-sac.
Transvaginal technique was performed to assess early pregnancy.

[Series 1: us ob transvaginal · 0.21mm/px · 14 of 109 slices shown]
[im 5/109]
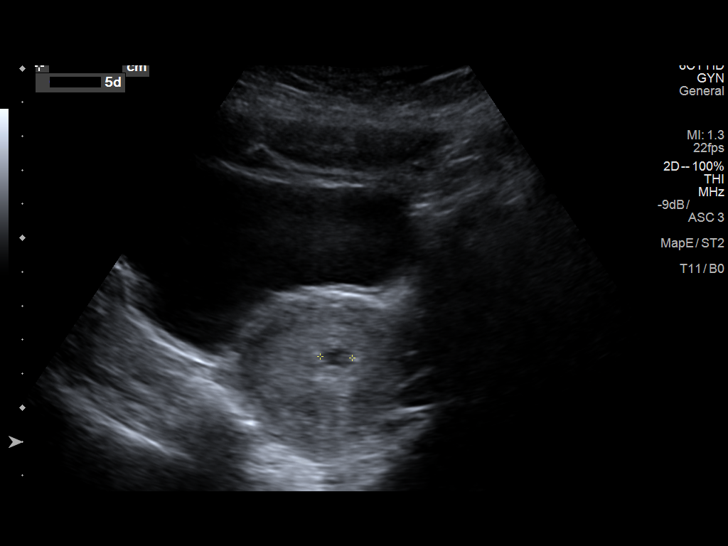
[im 13/109]
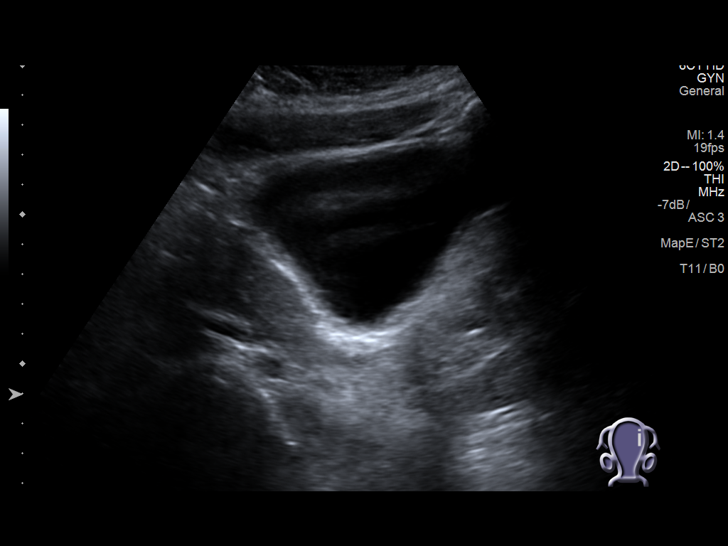
[im 21/109]
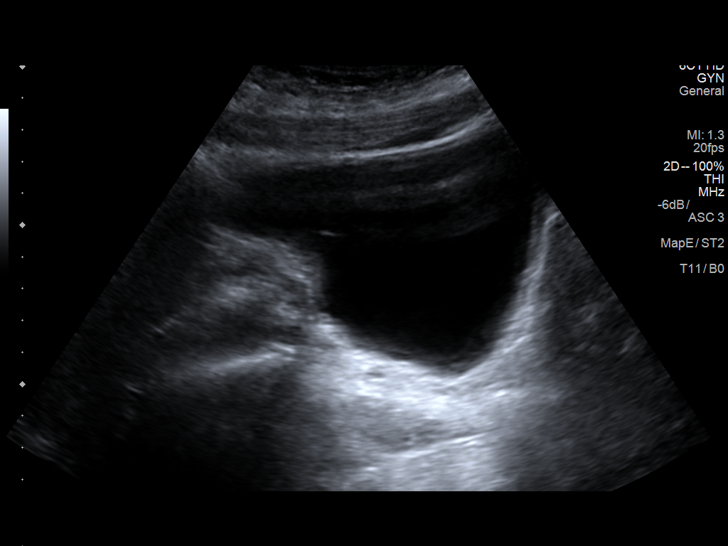
[im 29/109]
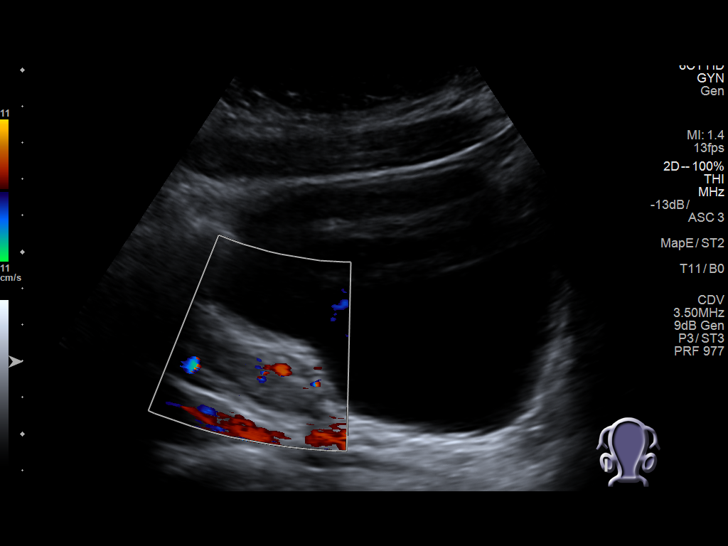
[im 37/109]
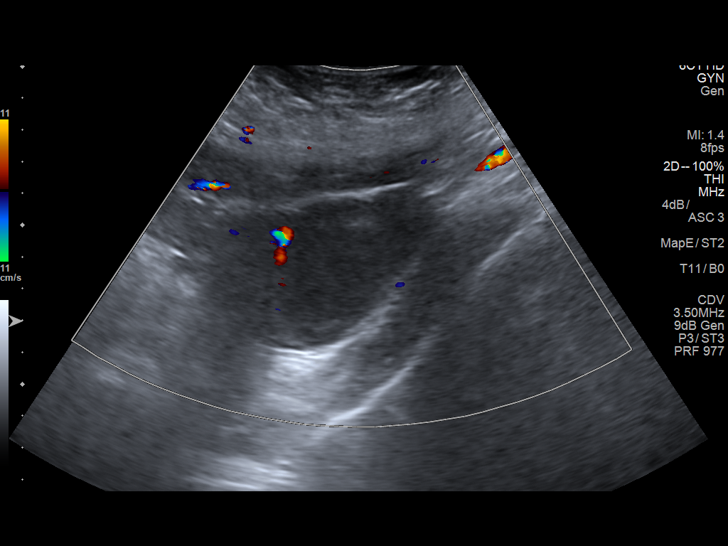
[im 45/109]
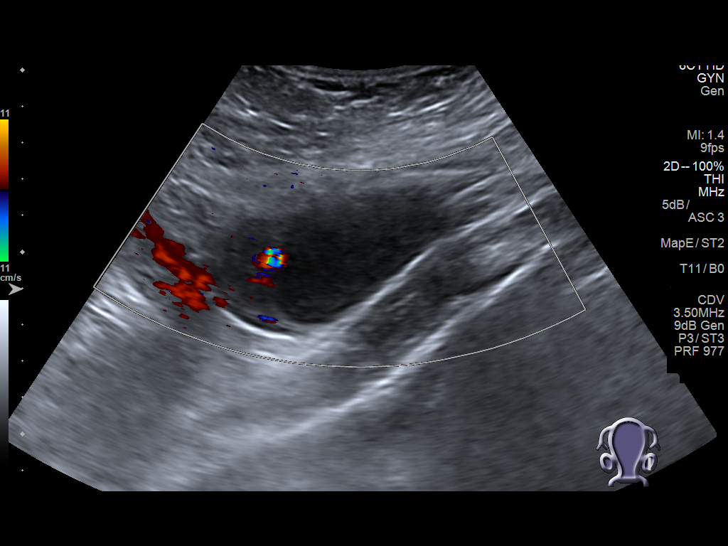
[im 53/109]
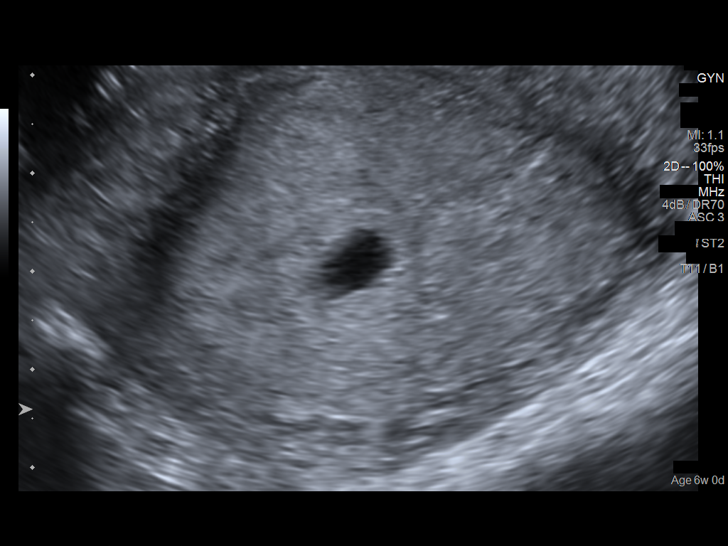
[im 61/109]
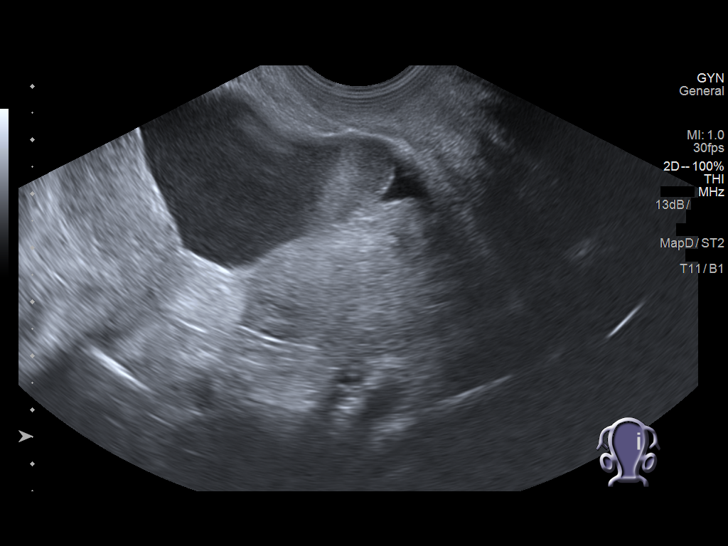
[im 69/109]
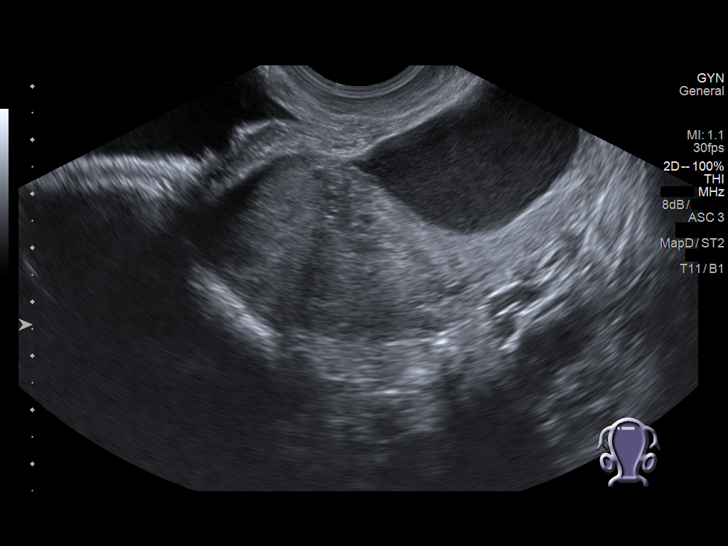
[im 77/109]
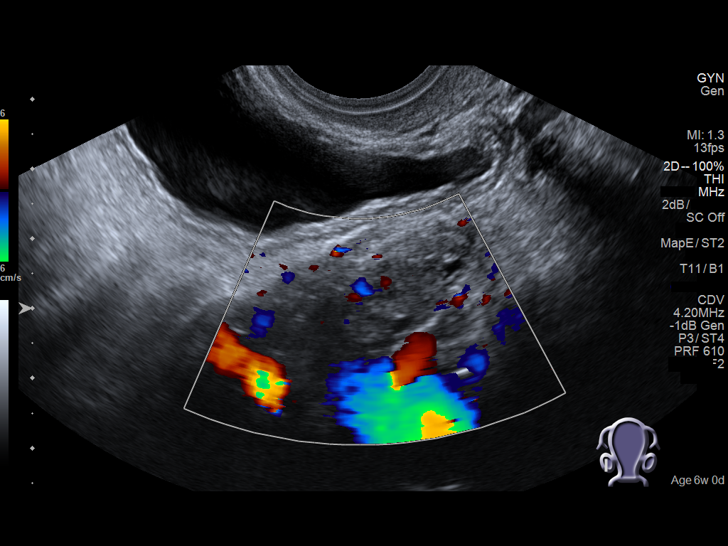
[im 85/109]
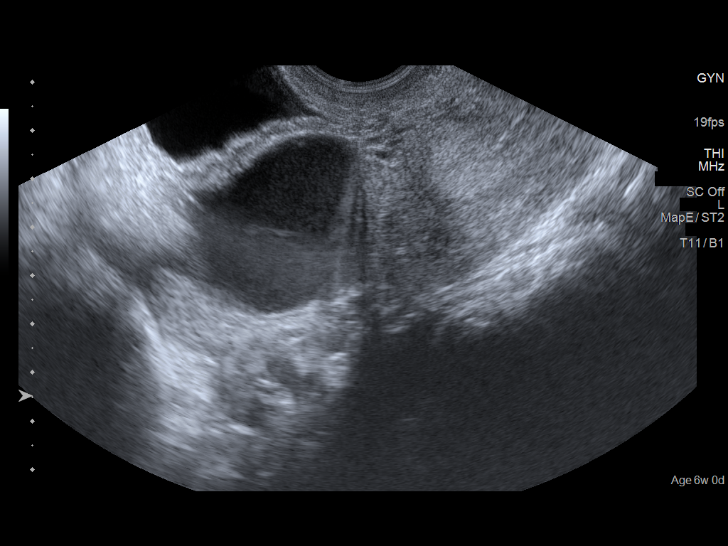
[im 93/109]
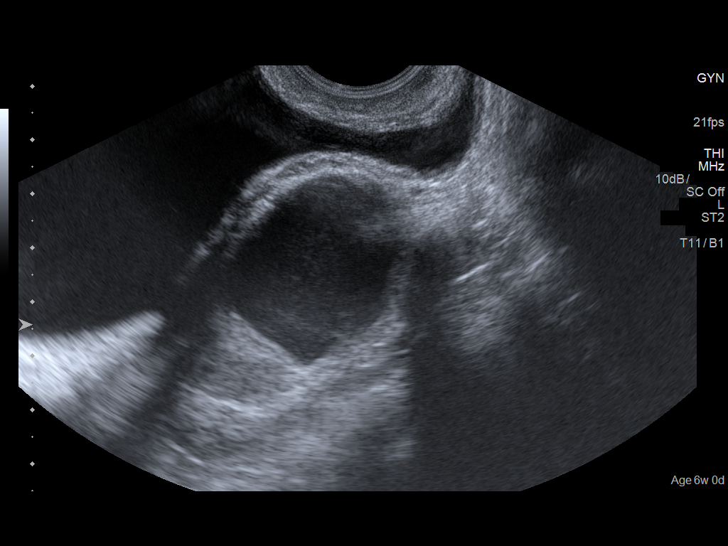
[im 101/109]
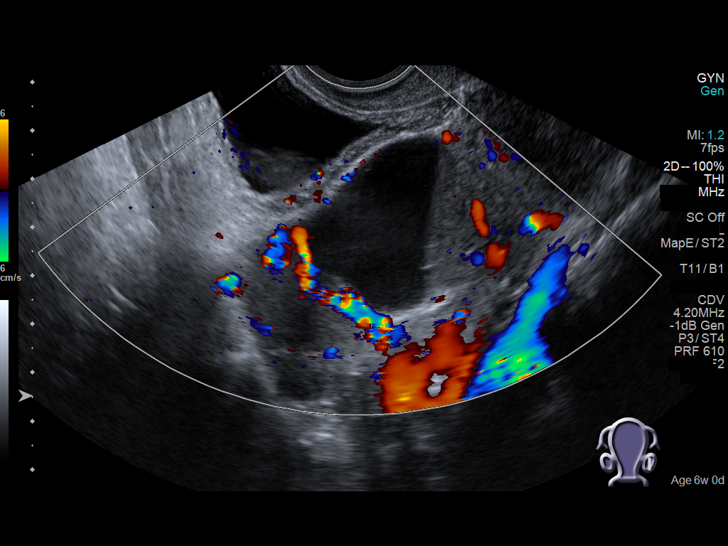
[im 109/109]
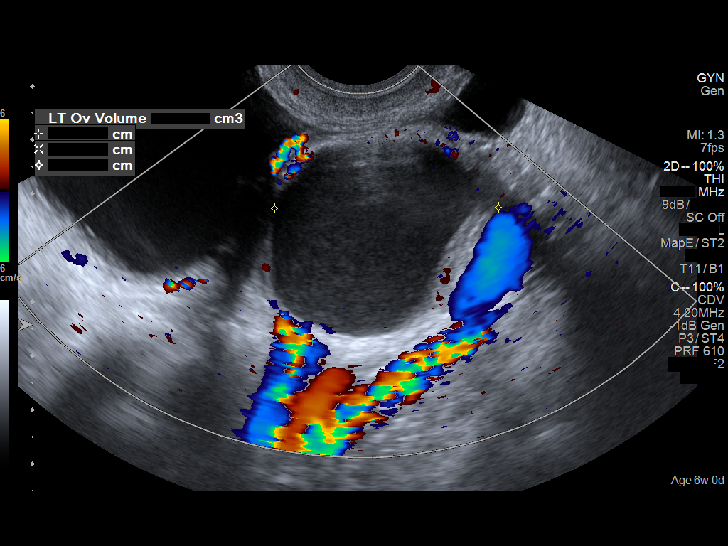

[14 of 28 positions shown; findings below may reference images not displayed]

FINDINGS: Intrauterine gestational sac: Single

Yolk sac:  Not visible

Embryo:  Not visible

Cardiac Activity: Not visible

Heart Rate:   bpm

MSD: 9  mm   5 w   5  d

CRL:    mm    w    d                  US EDC:

Subchorionic hemorrhage:  None visualized.

Maternal uterus/adnexae: Small volume free pelvic fluid. No adnexal
masses.
IMPRESSION: Probable early intrauterine gestational sac, but no yolk sac, fetal
pole, or cardiac activity yet visualized. Recommend follow-up
quantitative B-HCG levels and follow-up US in 14 days to assess
viability. This recommendation follows SRU consensus guidelines:
Diagnostic Criteria for Nonviable Pregnancy Early in the First
Trimester. N Engl J Med 4486; [DATE].
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
## 2019-09-27 ENCOUNTER — Telehealth: Payer: Self-pay

## 2019-09-27 NOTE — Progress Notes (Signed)
Abstracted per [facility] record Sharlette Dense, RN

## 2019-09-27 NOTE — Telephone Encounter (Signed)
Phone call to pt at [phone]. Left message on voicemail that RN with [facility] would like to set up a time to go over some things in relation to her upcoming 09/29/19 visit, we can go ahead and get some of the required information covered prior to visit. Please call [phone] to let us know a good time for a call.

## 2019-09-28 NOTE — Telephone Encounter (Signed)
Phone call to pt at [phone]. Left message on voicemail that RN with [facility] would like to set up a time to go over some things in relation to her upcoming 09/29/19 visit, we can go ahead and get some of the required information covered prior to visit. Please call [phone] to let us know a good time for a call. 

## 2019-09-29 ENCOUNTER — Other Ambulatory Visit: Payer: Self-pay

## 2019-09-29 ENCOUNTER — Ambulatory Visit: Payer: Medicaid Other | Admitting: Advanced Practice Midwife

## 2019-09-29 VITALS — BP 102/56 | HR 72 | Temp 97.4°F | Wt 169.4 lb

## 2019-09-29 DIAGNOSIS — Z3480 Encounter for supervision of other normal pregnancy, unspecified trimester: Secondary | ICD-10-CM | POA: Insufficient documentation

## 2019-09-29 DIAGNOSIS — Z3482 Encounter for supervision of other normal pregnancy, second trimester: Secondary | ICD-10-CM

## 2019-09-29 DIAGNOSIS — O0930 Supervision of pregnancy with insufficient antenatal care, unspecified trimester: Secondary | ICD-10-CM | POA: Insufficient documentation

## 2019-09-29 LAB — URINALYSIS
Bilirubin, UA: NEGATIVE
Glucose, UA: NEGATIVE
Ketones, UA: NEGATIVE
Leukocytes,UA: NEGATIVE
Nitrite, UA: NEGATIVE
Protein,UA: NEGATIVE
RBC, UA: NEGATIVE
Specific Gravity, UA: 1.01 (ref 1.005–1.030)
Urobilinogen, Ur: 0.2 mg/dL (ref 0.2–1.0)
pH, UA: 6.5 (ref 5.0–7.5)

## 2019-09-29 LAB — WET PREP FOR TRICH, YEAST, CLUE
Trichomonas Exam: NEGATIVE
Yeast Exam: NEGATIVE

## 2019-09-29 LAB — HEMOGLOBIN, FINGERSTICK: Hemoglobin: 11.2 g/dL (ref 11.1–15.9)

## 2019-09-29 MED ORDER — PRENATAL VITAMIN 27-0.8 MG PO TABS: 1.0000 | ORAL_TABLET | Freq: Every day | ORAL | 0 refills | Status: DC

## 2019-09-29 NOTE — Telephone Encounter (Signed)
Phone call to pt. Interviewed pt for initial prenatal visit for 09/29/19 PM visit.  See [facility] IP document.

## 2019-09-29 NOTE — Progress Notes (Signed)
[facility] HEALTH DEPT [facility] [address] [place] [address] [phone]  INITIAL PRENATAL VISIT NOTE  Subjective:  Ana Ortiz is a 24 y.o. [id number] (2 yo daughter) at [redacted]w[redacted]d being seen today to start prenatal care at the [facility] Department. She feels "I'm excited" about planned pregnancy with Mirena removed 06/21/19 after 18 mo and stopped ocp's 07/04/19.  23 yo unemployed husband of 3 years feels "excited" and they share their 2 yo daughter. 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  They have lived in [place] x 11 years but just returned from [place] after 1 month visit.  Living with husband and daughter.  LMP 06/16/19.  Denies ER use or u/s this pregnancy.  Last cig age 26.  Last ETOH 08/20/19 (5 shots Tequila) 1x/mo.  Last pap 10/27/17 neg.   She is currently monitored for the following issues for this low-risk pregnancy and has Late prenatal care affecting pregnancy, 14 6/7 wks and Prenatal care, subsequent pregnancy, second trimester on their problem list.  Patient reports no complaints.  Contractions: Not present. Vag. Bleeding: None.  Movement: Absent. Denies leaking of fluid.   Indications for ASA therapy (per uptodate) One of the following: Previous pregnancy with preeclampsia, especially early onset and with an adverse outcome No Multifetal gestation No Chronic hypertension No Type 1 or 2 diabetes mellitus No Chronic kidney disease No Autoimmune disease (antiphospholipid syndrome, systemic lupus erythematosus) No  Two or more of the following: Nulliparity No Obesity (body mass index >30 kg/m2) No Family history of preeclampsia in mother or sister No Age ?35 years No Sociodemographic characteristics (African American race, low socioeconomic level) No Personal risk factors (eg, previous pregnancy with low birth weight or small for gestational age infant, previous adverse pregnancy outcome [eg, stillbirth], interval >10  years between pregnancies) No   The following portions of the patient's history were reviewed and updated as appropriate: allergies, current medications, past family history, past medical history, past social history, past surgical history and problem list. Problem list updated.  Objective:   Vitals:   09/29/19 1343  BP: (!) 102/56  Pulse: 72  Temp: (!) 97.4 F (36.3 C)  Weight: 169 lb 6.4 oz (76.8 kg)    Fetal Status: Fetal Heart Rate (bpm): 160 Fundal Height: 14 cm Movement: Absent  Presentation: Undeterminable   Physical Exam Vitals and nursing note reviewed.  Constitutional:      General: She is not in acute distress.    Appearance: Normal appearance. She is well-developed and normal weight.  HENT:     Head: Normocephalic and atraumatic.     Right Ear: External ear normal.     Left Ear: External ear normal.     Nose: Nose normal. No congestion or rhinorrhea.     Mouth/Throat:     Lips: Pink.     Mouth: Mucous membranes are moist.     Dentition: Normal dentition. No dental caries.     Pharynx: Oropharynx is clear. Uvula midline.  Eyes:     General: No scleral icterus.    Conjunctiva/sclera: Conjunctivae normal.  Neck:     Thyroid: No thyroid mass or thyromegaly.  Cardiovascular:     Rate and Rhythm: Normal rate.     Pulses: Normal pulses.     Comments: Extremities are warm and well perfused Pulmonary:     Effort: Pulmonary effort is normal.     Breath sounds: Normal breath sounds.  Chest:  Breasts: Breasts are symmetrical.        Right: Normal. No mass, nipple discharge or skin change.        Left: Normal. No mass, nipple discharge or skin change.  Abdominal:     Palpations: Abdomen is soft.     Tenderness: There is no abdominal tenderness.     Comments: Gravid, soft without tenderness, FHR=160, fundus 3 FBaS  Genitourinary:    General: Normal vulva.     Exam position: Lithotomy position.     Pubic Area: No rash.      Labia:        Right: No rash.         Left: No rash.      Vagina: Vaginal discharge (white creamy leukorrhea, ph<4.5) present.     Cervix: Normal.     Uterus: Normal. Enlarged (Gravid 14 wk size). Not tender.      Adnexa: Right adnexa normal and left adnexa normal.     Rectum: Normal. No external hemorrhoid.  Musculoskeletal:     Right lower leg: No edema.     Left lower leg: No edema.  Lymphadenopathy:     Upper Body:     Right upper body: No axillary adenopathy.     Left upper body: No axillary adenopathy.  Skin:    General: Skin is warm.     Capillary Refill: Capillary refill takes less than 2 seconds.  Neurological:     Mental Status: She is alert.     Assessment and Plan:  Pregnancy: G2P1001 at [redacted]w[redacted]d  1. Late prenatal care affecting pregnancy, 14 6/7 wks   2. Prenatal care, subsequent pregnancy, second trimester Wants Quad screen but S<D so getting dating u/s first Quantiferon Gold - Chlamydia/GC NAA, Confirmation - HCV Ab w Reflex to Quant PCR - HIV Antibody (routine testing w rflx) - Lead, blood (adult age 8 yrs or greater) - Prenatal profile without Varicella or Rubella - Urine Culture - Prenatal Vit-Fe Fumarate-FA (PRENATAL VITAMIN) 27-0.8 MG TABS; Take 1 tablet by mouth daily.  Dispense: 100 tablet; Refill: 0 - QuantiFERON-TB Gold Plus - 073710 Drug Screen - WET PREP FOR TRICH, YEAST, CLUE - Hemoglobin, venipuncture - Urinalysis (Urine Dip)    Discussed overview of care and coordination with inpatient delivery practices including [facility], [facility], [facility] and [facility].   Reviewed Centering pregnancy as standard of care at [facility], oriented to room and showed video. Based on EDD, plan for Cycle  .    Preterm labor symptoms and general obstetric precautions including but not limited to vaginal bleeding, contractions, leaking of fluid and fetal movement were reviewed in detail with the patient.  Please refer to After Visit Summary for other counseling recommendations.   No  follow-ups on file.  No future appointments.  Alberteen Spindle, CNM

## 2019-09-29 NOTE — Progress Notes (Signed)
Phone call to pt for initial prenatal visit. Pt states she is in a safe location, not at home, waiting for family member at their appt. Pt expresses desire to review information prior to coming in for initial prenatal visit today. Pt desires PNV (has BCBS, but insurance does not cover much, will be applying for Medicaid today). Pt states she had Mirena IUD removed on 06/21/19, started OCP, stopped OCP 07/04/19 desiring pregnancy, no menses since 06/17/19. Pt states she did travel to [place] from [place]/14/19 - 09/15/19. Pt does desire Food/Diet information today. Pt confirms delivery will be at [facility].

## 2019-09-29 NOTE — Progress Notes (Signed)
Patient here for new OB at about 14 6/7. Patient had mirena until may 2021 and then used OC, stopped OC on July 04, 2019. Planned to be pregnant and does not want more pregnancies after this.Burt Knack, RN

## 2019-09-29 NOTE — Progress Notes (Signed)
In house labs reviewed, no treatment needed. [facility] U/S referral faxed, patient aware to expect TC from [facility] to schedule appointment. PNV given today.Burt Knack, RN

## 2019-09-30 LAB — CBC/D/PLT+RPR+RH+ABO+AB SCR
Antibody Screen: NEGATIVE
Basophils Absolute: 0 10*3/uL (ref 0.0–0.2)
Basos: 1 %
EOS (ABSOLUTE): 0.1 10*3/uL (ref 0.0–0.4)
Eos: 1 %
Hematocrit: 32.5 % — ABNORMAL LOW (ref 34.0–46.6)
Hemoglobin: 11.2 g/dL (ref 11.1–15.9)
Hepatitis B Surface Ag: NEGATIVE
Immature Grans (Abs): 0 10*3/uL (ref 0.0–0.1)
Immature Granulocytes: 0 %
Lymphocytes Absolute: 1.9 10*3/uL (ref 0.7–3.1)
Lymphs: 36 %
MCH: 32.7 pg (ref 26.6–33.0)
MCHC: 34.5 g/dL (ref 31.5–35.7)
MCV: 95 fL (ref 79–97)
Monocytes Absolute: 0.3 10*3/uL (ref 0.1–0.9)
Monocytes: 6 %
Neutrophils Absolute: 3 10*3/uL (ref 1.4–7.0)
Neutrophils: 56 %
Platelets: 263 10*3/uL (ref 150–450)
RBC: 3.42 x10E6/uL — ABNORMAL LOW (ref 3.77–5.28)
RDW: 12.2 % (ref 11.7–15.4)
RPR Ser Ql: NONREACTIVE
Rh Factor: POSITIVE
WBC: 5.4 10*3/uL (ref 3.4–10.8)

## 2019-09-30 LAB — HCV AB W REFLEX TO QUANT PCR: HCV Ab: <0.1 s/co ratio (ref 0.0–0.9)

## 2019-09-30 LAB — 789231 7+OXYCODONE-BUND
Amphetamines, Urine: NEGATIVE ng/mL
BENZODIAZ UR QL: NEGATIVE ng/mL
Barbiturate screen, urine: NEGATIVE ng/mL
Cannabinoid Quant, Ur: NEGATIVE ng/mL
Cocaine (Metab.): NEGATIVE ng/mL
OPIATE SCREEN URINE: NEGATIVE ng/mL
Oxycodone/Oxymorphone, Urine: NEGATIVE ng/mL
PCP Quant, Ur: NEGATIVE ng/mL

## 2019-09-30 LAB — HIV ANTIBODY (ROUTINE TESTING W REFLEX): HIV Screen 4th Generation wRfx: NONREACTIVE

## 2019-09-30 LAB — HCV INTERPRETATION

## 2019-10-01 LAB — QUANTIFERON-TB GOLD PLUS
QuantiFERON Mitogen Value: >10 IU/mL
QuantiFERON Nil Value: 0 IU/mL
QuantiFERON TB1 Ag Value: 0.01 IU/mL
QuantiFERON TB2 Ag Value: 0.02 IU/mL
QuantiFERON-TB Gold Plus: NEGATIVE

## 2019-10-01 LAB — URINE CULTURE

## 2019-10-01 LAB — LEAD, BLOOD (ADULT >= 16 YRS): Lead-Whole Blood: <1 ug/dL (ref 0–4)

## 2019-10-02 LAB — CHLAMYDIA/GC NAA, CONFIRMATION
Chlamydia trachomatis, NAA: NEGATIVE
Neisseria gonorrhoeae, NAA: NEGATIVE

## 2019-10-08 ENCOUNTER — Encounter: Payer: Self-pay | Admitting: Family Medicine

## 2019-10-08 DIAGNOSIS — Z3482 Encounter for supervision of other normal pregnancy, second trimester: Secondary | ICD-10-CM

## 2019-10-27 ENCOUNTER — Ambulatory Visit: Payer: Medicaid Other | Admitting: Advanced Practice Midwife

## 2019-10-27 ENCOUNTER — Other Ambulatory Visit: Payer: Self-pay

## 2019-10-27 ENCOUNTER — Ambulatory Visit: Payer: BC Managed Care – PPO

## 2019-10-27 VITALS — BP 105/61 | HR 76 | Temp 98.0°F | Wt 172.4 lb

## 2019-10-27 DIAGNOSIS — Z3482 Encounter for supervision of other normal pregnancy, second trimester: Secondary | ICD-10-CM

## 2019-10-27 NOTE — Progress Notes (Signed)
   PRENATAL VISIT NOTE  Subjective:  Ana Ortiz is a 24 y.o. G2P1001 at [redacted]w[redacted]d being seen today for ongoing prenatal care.  She is currently monitored for the following issues for this low-risk pregnancy and has Late prenatal care affecting pregnancy, 14 6/7 wks and Prenatal care, subsequent pregnancy, second trimester on their problem list.  Patient reports no complaints.  Contractions: Not present. Vag. Bleeding: None.  Movement: Absent. Denies leaking of fluid/ROM.   The following portions of the patient's history were reviewed and updated as appropriate: allergies, current medications, past family history, past medical history, past social history, past surgical history and problem list. Problem list updated.  Objective:   Vitals:   10/27/19 1342  BP: 105/61  Pulse: 76  Temp: 98 F (36.7 C)  Weight: 172 lb 6.4 oz (78.2 kg)    Fetal Status: Fetal Heart Rate (bpm): 160 Fundal Height: 14 cm Movement: Absent     General:  Alert, oriented and cooperative. Patient is in no acute distress.  Skin: Skin is warm and dry. No rash noted.   Cardiovascular: Normal heart rate noted  Respiratory: Normal respiratory effort, no problems with respiration noted  Abdomen: Soft, gravid, appropriate for gestational age.  Pain/Pressure: Absent     Pelvic: Cervical exam deferred        Extremities: Normal range of motion.  Edema: None  Mental Status: Normal mood and affect. Normal behavior. Normal judgment and thought content.   Assessment and Plan:  Pregnancy: G2P1001 at [redacted]w[redacted]d 1. Prenatal care, subsequent pregnancy, second trimester Working 24-60 hrs/wk.  Wants Quad screen Reviewed 10/06/19 u/s at 11 4/7 wks and EDC changed to 04/22/20 Reminded of anatomy u/s 11/29/19   Preterm labor symptoms and general obstetric precautions including but not limited to vaginal bleeding, contractions, leaking of fluid and fetal movement were reviewed in detail with the patient. Please refer to After Visit Summary  for other counseling recommendations.  No follow-ups on file.  No future appointments.  Alberteen Spindle, CNM

## 2019-10-27 NOTE — Progress Notes (Signed)
Here today for 14.4 week MH RV. Taking PNV QD. Denies ED/hospital visits since last appt. Aware of scheduled 11/29/19 [facility] Anatomy scan. Tawny Hopping, RN

## 2019-11-24 ENCOUNTER — Other Ambulatory Visit: Payer: Self-pay

## 2019-11-24 ENCOUNTER — Ambulatory Visit: Payer: Medicaid Other | Admitting: Advanced Practice Midwife

## 2019-11-24 VITALS — BP 104/55 | HR 70 | Temp 98.2°F | Wt 173.6 lb

## 2019-11-24 DIAGNOSIS — Z3482 Encounter for supervision of other normal pregnancy, second trimester: Secondary | ICD-10-CM

## 2019-11-24 NOTE — Progress Notes (Signed)
TC to [facility] to schedule U/S needed now. Per Wynona Canes at [facility] scheduling, patient counseled to leave from [facility] after making next visit appointment and go right over to [facility] for U/S. Per Wynona Canes, the order for anatomy scan added to today's viability scan, to be done if appropriate. 11/29/19 anatomy U/S canceled per [facility], but held upon request. To be followed up on by RN after results of today's U/S are reviewed by provider.Burt Knack, RN

## 2019-11-24 NOTE — Progress Notes (Signed)
U/S results from today reviewed with provider, E. Sciora. Per [employer] and [place] in scheduling at [facility], patient is scheduled for anatomy U/S on 11/29/2019 at 10:00 as previously planned. TC to patient who is aware of U/S results and appointment on 11/29/2019.Marland KitchenBurt Knack, RN

## 2019-11-24 NOTE — Progress Notes (Signed)
   PRENATAL VISIT NOTE  Subjective:  Ana Ortiz is a 24 y.o. G2P1001 at [redacted]w[redacted]d being seen today for ongoing prenatal care.  She is currently monitored for the following issues for this low-risk pregnancy and has Late prenatal care affecting pregnancy, 14 6/7 wks and Prenatal care, subsequent pregnancy, second trimester on their problem list.  Patient reports no complaints.  Contractions: Not present. Vag. Bleeding: None.  Movement: Absent. Denies leaking of fluid/ROM.   The following portions of the patient's history were reviewed and updated as appropriate: allergies, current medications, past family history, past medical history, past social history, past surgical history and problem list. Problem list updated.  Objective:   Vitals:   11/24/19 1032  BP: (!) 104/55  Pulse: 70  Temp: 98.2 F (36.8 C)  Weight: 173 lb 9.6 oz (78.7 kg)    Fetal Status: Fetal Heart Rate (bpm): not heard Fundal Height: 19 cm Movement: Absent     General:  Alert, oriented and cooperative. Patient is in no acute distress.  Skin: Skin is warm and dry. No rash noted.   Cardiovascular: Normal heart rate noted  Respiratory: Normal respiratory effort, no problems with respiration noted  Abdomen: Soft, gravid, appropriate for gestational age.  Pain/Pressure: Absent     Pelvic: Cervical exam deferred        Extremities: Normal range of motion.  Edema: None  Mental Status: Normal mood and affect. Normal behavior. Normal judgment and thought content.   Assessment and Plan:  Pregnancy: G2P1001 at [redacted]w[redacted]d  1. Prenatal care, subsequent pregnancy, second trimester FHR not heard at 20 4/7--viability u/s ordered for today Working 16-24 hrs/wk Wants flu shot today Desires Quad screen Wants BTL Reminded of anatomy u/s on 11/29/19 - QUAD Screen [facility] Only   Preterm labor symptoms and general obstetric precautions including but not limited to vaginal bleeding, contractions, leaking of fluid and fetal movement were  reviewed in detail with the patient. Please refer to After Visit Summary for other counseling recommendations.  Return in about 4 weeks (around 12/22/2019) for routine PNC.  No future appointments.  Alberteen Spindle, CNM

## 2019-12-14 NOTE — Addendum Note (Signed)
Addended by: Heywood Bene on: 12/14/2019 12:16 PM   Modules accepted: Orders

## 2019-12-22 ENCOUNTER — Other Ambulatory Visit: Payer: Self-pay

## 2019-12-22 ENCOUNTER — Ambulatory Visit: Payer: Medicaid Other | Admitting: Family Medicine

## 2019-12-22 DIAGNOSIS — Z3482 Encounter for supervision of other normal pregnancy, second trimester: Secondary | ICD-10-CM

## 2019-12-22 NOTE — Progress Notes (Signed)
Pregnancy Care Home forms completed by client today. Jossie Ng, RN

## 2019-12-22 NOTE — Progress Notes (Signed)
   PRENATAL VISIT NOTE  Subjective:  Ana Ortiz is a 24 y.o. G2P1001 at [redacted]w[redacted]d being seen today for ongoing prenatal care.  She is currently monitored for the following issues for this low-risk pregnancy and has Late prenatal care affecting pregnancy, 14 6/7 wks and Prenatal care, subsequent pregnancy, second trimester on their problem list.  Patient reports no complaints.  Contractions: Not present. Vag. Bleeding: None.  Movement: Present. Denies leaking of fluid/ROM.   The following portions of the patient's history were reviewed and updated as appropriate: allergies, current medications, past family history, past medical history, past social history, past surgical history and problem list. Problem list updated.  Objective:   Vitals:   12/22/19 1029  BP: (!) 93/53  Pulse: 89  Temp: 98.7 F (37.1 C)  Weight: 178 lb 9.6 oz (81 kg)    Fetal Status: Fetal Heart Rate (bpm): 147 Fundal Height: 21 cm Movement: Present     General:  Alert, oriented and cooperative. Patient is in no acute distress.  Skin: Skin is warm and dry. No rash noted.   Cardiovascular: Normal heart rate noted  Respiratory: Normal respiratory effort, no problems with respiration noted  Abdomen: Soft, gravid, appropriate for gestational age.  Pain/Pressure: Absent     Pelvic: Cervical exam deferred        Extremities: Normal range of motion.  Edema: None  Mental Status: Normal mood and affect. Normal behavior. Normal judgment and thought content.   Assessment and Plan:  Pregnancy: G2P1001 at [redacted]w[redacted]d  1. Prenatal care, subsequent pregnancy, second trimester   Patient reports doing well with pregnancy, she has had some lower back pain while working.    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  Patient informed that she spoke with supervisor about changing positions but at this time has not heard anything, this was about 1 week ago.   Will follow up with patient on job changes.   Discussed with patient about lower back exercises and strengthening core.  Using [employer] to look up safe exercise during pregnancy.     Patient reports that she wants BTL as form of birth control.  She does not want any more children.    Preterm labor symptoms and general obstetric precautions including but not limited to vaginal bleeding, contractions, leaking of fluid and fetal movement were reviewed in detail with the patient. Please refer to After Visit Summary for other counseling recommendations.    Return in about 4 weeks (around 01/19/2020) for routine prenatal care.  Future Appointments  Date Time Provider Department Center  01/17/2020 10:00 AM [facility] PROVIDER [facility] None    Wendi Snipes, FNP

## 2019-12-27 ENCOUNTER — Encounter: Payer: Self-pay | Admitting: Family Medicine

## 2020-01-03 ENCOUNTER — Telehealth: Payer: Self-pay | Admitting: Family Medicine

## 2020-01-03 ENCOUNTER — Encounter: Payer: Self-pay | Admitting: Advanced Practice Midwife

## 2020-01-03 NOTE — Telephone Encounter (Signed)
Pt. States she is pregnant and has a cold. She wants to know what kind of medicine she can take.

## 2020-01-03 NOTE — Telephone Encounter (Signed)
Call to client who reports onset 01/02/2020 of dry cough and runny nose with clear drainage. Afebrile and no other symptoms. Reports daughter recently had cold and was Covid negative at [facility]. Client encouraged to have Covid test and [facility] testing site info given. Reviewed Safe Medications in Pregnancy List with client (who has copy of list at home). Jossie Ng, RN

## 2020-01-17 ENCOUNTER — Other Ambulatory Visit: Payer: Self-pay

## 2020-01-17 ENCOUNTER — Encounter: Payer: Self-pay | Admitting: Family Medicine

## 2020-01-17 ENCOUNTER — Ambulatory Visit: Payer: Medicaid Other | Admitting: Family Medicine

## 2020-01-17 VITALS — BP 98/64 | HR 85 | Temp 98.0°F | Wt 185.0 lb

## 2020-01-17 DIAGNOSIS — Z3480 Encounter for supervision of other normal pregnancy, unspecified trimester: Secondary | ICD-10-CM

## 2020-01-17 DIAGNOSIS — Z23 Encounter for immunization: Secondary | ICD-10-CM

## 2020-01-17 DIAGNOSIS — Z3482 Encounter for supervision of other normal pregnancy, second trimester: Secondary | ICD-10-CM

## 2020-01-17 DIAGNOSIS — O0930 Supervision of pregnancy with insufficient antenatal care, unspecified trimester: Secondary | ICD-10-CM

## 2020-01-17 MED ORDER — PRENATAL VITAMIN 27-0.8 MG PO TABS: 1.0000 | ORAL_TABLET | Freq: Every day | ORAL | 11 refills | Status: DC

## 2020-01-17 NOTE — Progress Notes (Signed)
BTL consent signed and copy to client. Copy also slid under R. Marlan Palau MSW, OBCM office door. Original sent for scannng. Jossie Ng, RN

## 2020-01-17 NOTE — Progress Notes (Signed)
  PRENATAL VISIT NOTE  Subjective:  Ana Ortiz is a 24 y.o. G2P1001 at [redacted]w[redacted]d being seen today for ongoing prenatal care.  She is currently monitored for the following issues for this low-risk pregnancy and has Late prenatal care affecting pregnancy, 14 6/7 wks and Supervision of other normal pregnancy, antepartum on their problem list.  Patient reports no complaints.  Contractions: Not present. Vag. Bleeding: None.  Movement: Present. Denies leaking of fluid/ROM.   The following portions of the patient's history were reviewed and updated as appropriate: allergies, current medications, past family history, past medical history, past social history, past surgical history and problem list. Problem list updated.  Objective:   Vitals:   01/17/20 1018  BP: 98/64  Pulse: 85  Temp: 98 F (36.7 C)  Weight: 185 lb (83.9 kg)    Fetal Status: Fetal Heart Rate (bpm): 148 Fundal Height: 27 cm Movement: Present     General:  Alert, oriented and cooperative. Patient is in no acute distress.  Skin: Skin is warm and dry. No rash noted.   Cardiovascular: Normal heart rate noted  Respiratory: Normal respiratory effort, no problems with respiration noted  Abdomen: Soft, gravid, appropriate for gestational age.  Pain/Pressure: Absent     Pelvic: Cervical exam deferred        Extremities: Normal range of motion.  Edema: None  Mental Status: Normal mood and affect. Normal behavior. Normal judgment and thought content.   Assessment and Plan:  Pregnancy: G2P1001 at [redacted]w[redacted]d  1. Supervision of other normal pregnancy, antepartum Up to date TWG=28 lb (12.7 kg) which is approaching goal but appropriate Desires BTL and medicaid paperwork signed today-- patient given copy  Reviewed alternatives to BTS including nexplanon and IUD. Discussed both nexplanon and IUD can be placed in hospital as well.  Discussed with patient the importance of telling her delivery team her desire for BTS to try to get pp tubal and/or  coordinate with OB to have interval  2. Late prenatal care affecting pregnancy, 14 6/7 wks UTD   Preterm labor symptoms and general obstetric precautions including but not limited to vaginal bleeding, contractions, leaking of fluid and fetal movement were reviewed in detail with the patient. Please refer to After Visit Summary for other counseling recommendations.  Return in about 2 weeks (around 01/31/2020) for Routine prenatal care, before 10am or before 2pm--28 wk labs.  Future Appointments  Date Time Provider Department Center  02/02/2020  9:00 AM [facility] PROVIDER [facility] None    Federico Flake, MD

## 2020-01-18 ENCOUNTER — Telehealth: Payer: Self-pay | Admitting: Family Medicine

## 2020-01-18 MED ORDER — PRENATAL VITAMIN 27-0.8 MG PO TABS: 1.0000 | ORAL_TABLET | Freq: Every day | ORAL | 11 refills | Status: AC

## 2020-01-18 NOTE — Addendum Note (Signed)
Addended by: Burt Knack on: 01/18/2020 03:58 PM   Modules accepted: Orders

## 2020-01-18 NOTE — Telephone Encounter (Signed)
TC to patient this a.m. and patient states she has called her pharmacy several times and they keep telling her they don't have the prescription for PNV. TC to pharmacy, put on hold for 15 minutes. TC to pharmacy later and talked with pharmacy who state they don't have order for PNV as stated by patient. TC to Dr. Alvester Morin to ask her to re-order the PNV with "normal" instead of "sample" so that order will go to pharmacy. Patient pharmacy was verified with patient previously. LM for Dr. Alvester Morin and patient informed of situation, and counseled to call us back tomorrow afternoon if she is still unable to get her PNV. Patient agrees to plan and states understanding.Burt Knack, RN

## 2020-02-02 ENCOUNTER — Ambulatory Visit: Payer: Medicaid Other

## 2020-02-03 ENCOUNTER — Encounter: Payer: Self-pay | Admitting: Family Medicine

## 2020-02-03 ENCOUNTER — Ambulatory Visit: Payer: No Typology Code available for payment source | Admitting: Family Medicine

## 2020-02-03 ENCOUNTER — Other Ambulatory Visit: Payer: Self-pay

## 2020-02-03 VITALS — BP 107/64 | HR 86 | Temp 98.3°F | Wt 192.2 lb

## 2020-02-03 DIAGNOSIS — Z3480 Encounter for supervision of other normal pregnancy, unspecified trimester: Secondary | ICD-10-CM

## 2020-02-03 DIAGNOSIS — O99013 Anemia complicating pregnancy, third trimester: Secondary | ICD-10-CM

## 2020-02-03 DIAGNOSIS — Z3483 Encounter for supervision of other normal pregnancy, third trimester: Secondary | ICD-10-CM | POA: Diagnosis not present

## 2020-02-03 DIAGNOSIS — Z23 Encounter for immunization: Secondary | ICD-10-CM

## 2020-02-03 HISTORY — DX: Anemia complicating pregnancy, third trimester: O99.013

## 2020-02-03 LAB — HEMOGLOBIN, FINGERSTICK: Hemoglobin: 10.7 g/dL — ABNORMAL LOW (ref 11.1–15.9)

## 2020-02-03 MED ORDER — IRON (FERROUS SULFATE) 325 (65 FE) MG PO TABS: 325.0000 mg | ORAL_TABLET | Freq: Every day | ORAL | 0 refills | Status: DC

## 2020-02-03 NOTE — Progress Notes (Signed)
   PRENATAL VISIT NOTE  Subjective:  Ana Ortiz is a 25 y.o. G2P1001 at [redacted]w[redacted]d being seen today for ongoing prenatal care.  She is currently monitored for the following issues for this low-risk pregnancy and has Late prenatal care affecting pregnancy, 14 6/7 wks; Supervision of other normal pregnancy, antepartum; and Anemia of mother in pregnancy, antepartum, third trimester on their problem list.  Patient reports no complaints and swelling of BLE.  Contractions: Not present. Vag. Bleeding: None.  Movement: Present. Denies leaking of fluid/ROM.   The following portions of the patient's history were reviewed and updated as appropriate: allergies, current medications, past family history, past medical history, past social history, past surgical history and problem list. Problem list updated.  Objective:   Vitals:   02/03/20 0909  BP: 107/64  Pulse: 86  Temp: 98.3 F (36.8 C)  Weight: 192 lb 3.2 oz (87.2 kg)    Fetal Status: Fetal Heart Rate (bpm): 140 Fundal Height: 28 cm Movement: Present     General:  Alert, oriented and cooperative. Patient is in no acute distress.  Skin: Skin is warm and dry. No rash noted.   Cardiovascular: Normal heart rate noted  Respiratory: Normal respiratory effort, no problems with respiration noted  Abdomen: Soft, gravid, appropriate for gestational age.  Pain/Pressure: Absent     Pelvic: Cervical exam deferred        Extremities: Normal range of motion.  Edema: None  Mental Status: Normal mood and affect. Normal behavior. Normal judgment and thought content.   Assessment and Plan:  Pregnancy: G2P1001 at [redacted]w[redacted]d  1. Supervision of other normal pregnancy, antepartum  Patient has 7 lb weight gain since last visit.  Upon discussion, Patient reports that travel to [place] fron 12[place]17-1/4 and had some swelling of BLE.  Patient reports the she had 5 hr flight and and also drank lots of gatorade and water due to being very hot.  Patient reports that her  output was not close to intake at that time. swelling was for 2 days with some tingling in her feet.  This is not currently happening.  No edema at today's visit. Pt blood pressure is WNL, no reports of HA, blurry vision or  Patient to notify clinic if the BLE or N/V  Edema happens again.    28 week labs completed today.    Discussed with patient about doula services.  Patient is interested in having a doula.  Informed patient to let [facility] know upon arrival.    Discussed changes in visit to Q 2 weeks.    - Hemoglobin, venipuncture - HIV-1/HIV-2 Qualitative RNA - RPR - Glucose, 1 hour gestational    2. Anemia of mother in pregnancy, antepartum, third trimester  - Fe+CBC/D/Plt+TIBC+Fer+Retic - Iron, Ferrous Sulfate, 325 (65 Fe) MG TABS; Take 325 mg by mouth daily.  Dispense: 100 tablet; Refill: 0  Preterm labor symptoms and general obstetric precautions including but not limited to vaginal bleeding, contractions, leaking of fluid and fetal movement were reviewed in detail with the patient. Please refer to After Visit Summary for other counseling recommendations.   Return in about 2 weeks (around 02/17/2020) for routine prenatal care.  Future Appointments  Date Time Provider Department Center  02/17/2020 10:20 AM [facility] PROVIDER [facility] None    Wendi Snipes, FNP

## 2020-02-03 NOTE — Progress Notes (Signed)
Counseled on Tdap recommendation for those who will spend any amount of time around infant. Client tolerated Tdap injection today without complaint. 28 week labs today.  Jossie Ng, RN  Hgb = 10.7 and iron initiated initiated per standing order. Jossie Ng, RN

## 2020-02-04 LAB — GLUCOSE, 1 HOUR GESTATIONAL: Gestational Diabetes Screen: 91 mg/dL (ref 65–139)

## 2020-02-05 LAB — FE+CBC/D/PLT+TIBC+FER+RETIC
Basophils Absolute: 0 10*3/uL (ref 0.0–0.2)
Basos: 1 %
EOS (ABSOLUTE): 0 10*3/uL (ref 0.0–0.4)
Eos: 1 %
Ferritin: 20 ng/mL (ref 15–150)
Hematocrit: 30.4 % — ABNORMAL LOW (ref 34.0–46.6)
Hemoglobin: 10.9 g/dL — ABNORMAL LOW (ref 11.1–15.9)
Immature Grans (Abs): 0 10*3/uL (ref 0.0–0.1)
Immature Granulocytes: 0 %
Iron Saturation: 23 % (ref 15–55)
Iron: 95 ug/dL (ref 27–159)
Lymphocytes Absolute: 0.9 10*3/uL (ref 0.7–3.1)
Lymphs: 15 %
MCH: 32.9 pg (ref 26.6–33.0)
MCHC: 35.9 g/dL — ABNORMAL HIGH (ref 31.5–35.7)
MCV: 92 fL (ref 79–97)
Monocytes Absolute: 0.3 10*3/uL (ref 0.1–0.9)
Monocytes: 5 %
Neutrophils Absolute: 4.7 10*3/uL (ref 1.4–7.0)
Neutrophils: 78 %
Platelets: 240 10*3/uL (ref 150–450)
RBC: 3.31 x10E6/uL — ABNORMAL LOW (ref 3.77–5.28)
RDW: 12.4 % (ref 11.7–15.4)
Retic Ct Pct: 2.2 % (ref 0.6–2.6)
Total Iron Binding Capacity: 410 ug/dL (ref 250–450)
UIBC: 315 ug/dL (ref 131–425)
WBC: 5.9 10*3/uL (ref 3.4–10.8)

## 2020-02-05 LAB — HIV-1/HIV-2 QUALITATIVE RNA
HIV-1 RNA, Qualitative: NONREACTIVE
HIV-2 RNA, Qualitative: NONREACTIVE

## 2020-02-05 LAB — RPR: RPR Ser Ql: NONREACTIVE

## 2020-02-17 ENCOUNTER — Ambulatory Visit: Payer: Medicaid Other

## 2020-02-21 ENCOUNTER — Other Ambulatory Visit: Payer: Self-pay

## 2020-02-21 ENCOUNTER — Ambulatory Visit: Payer: No Typology Code available for payment source | Admitting: Family Medicine

## 2020-02-21 VITALS — BP 106/67 | HR 83 | Temp 98.6°F | Wt 189.8 lb

## 2020-02-21 DIAGNOSIS — O99013 Anemia complicating pregnancy, third trimester: Secondary | ICD-10-CM

## 2020-02-21 DIAGNOSIS — Z3A31 31 weeks gestation of pregnancy: Secondary | ICD-10-CM

## 2020-02-21 DIAGNOSIS — Z3480 Encounter for supervision of other normal pregnancy, unspecified trimester: Secondary | ICD-10-CM

## 2020-02-21 DIAGNOSIS — O0930 Supervision of pregnancy with insufficient antenatal care, unspecified trimester: Secondary | ICD-10-CM

## 2020-02-21 NOTE — Progress Notes (Signed)
Patient here for MH RV at 31 2/7. States her leg and ankle swelling improved. She states she is taking her iron daily with juice and sometimes with water. Patient encouraged to take with vitamin C juice. Patient states she has noticed that when she is taking iron she is having more bowel movements than normal. Kick counts reviewed, cards given.Burt Knack, RN

## 2020-02-21 NOTE — Progress Notes (Signed)
PRENATAL VISIT NOTE  Subjective:  Ana Ortiz is a 25 y.o. G2P1001 at [redacted]w[redacted]d being seen today for ongoing prenatal care.  She is currently monitored for the following issues for this low-risk pregnancy and has Late prenatal care affecting pregnancy, 14 6/7 wks; Supervision of other normal pregnancy, antepartum; and Anemia of mother in pregnancy, antepartum, third trimester on their problem list.  Patient reports no complaints.  Contractions: Not present. Vag. Bleeding: None.  Movement: Present. Denies leaking of fluid/ROM.   The following portions of the patient's history were reviewed and updated as appropriate: allergies, current medications, past family history, past medical history, past social history, past surgical history and problem list. Problem list updated.  Objective:   Vitals:   02/21/20 1334  BP: 106/67  Pulse: 83  Temp: 98.6 F (37 C)  Weight: 189 lb 12.8 oz (86.1 kg)    Fetal Status: Fetal Heart Rate (bpm): 145 Fundal Height: 31 cm Movement: Present  Presentation: Vertex  General:  Alert, oriented and cooperative. Patient is in no acute distress.  Skin: Skin is warm and dry. No rash noted.   Cardiovascular: Normal heart rate noted  Respiratory: Normal respiratory effort, no problems with respiration noted  Abdomen: Soft, gravid, appropriate for gestational age.  Pain/Pressure: Absent     Pelvic: Cervical exam deferred        Extremities: Normal range of motion.  Edema: None  Mental Status: Normal mood and affect. Normal behavior. Normal judgment and thought content.   Assessment and Plan:  Pregnancy: G2P1001 at [redacted]w[redacted]d  1. Anemia of mother in pregnancy, antepartum, third trimester Taking Fe HGB next visit  2. Supervision of other normal pregnancy, antepartum Up to date currently  Reviewed contraception today- confirms desire for BTL. Discussed LARC options given age. She is very clear that she does not want any more children and her desired family size is 2  children  She reports NO breast growth this pregnancy and reports no lactogenesis in last pregnancy. She reports daughter lost signficant weight. Encouraged patient to see how her breast respond this delivery and to ask for supplement early -- discussed donor milk and formula use in the hospital.    3. Late prenatal care affecting pregnancy, 14 6/7 wks Up to date  4. [redacted] weeks gestation of pregnancy   Preterm labor symptoms and general obstetric precautions including but not limited to vaginal bleeding, contractions, leaking of fluid and fetal movement were reviewed in detail with the patient. Please refer to After Visit Summary for other counseling recommendations.   Return in about 2 weeks (around 03/06/2020) for Routine prenatal care.  No future appointments.  Federico Flake, MD

## 2020-03-06 ENCOUNTER — Other Ambulatory Visit: Payer: Self-pay

## 2020-03-06 ENCOUNTER — Ambulatory Visit: Payer: No Typology Code available for payment source | Admitting: Advanced Practice Midwife

## 2020-03-06 VITALS — BP 115/65 | HR 85 | Temp 98.1°F | Wt 192.6 lb

## 2020-03-06 DIAGNOSIS — Z3480 Encounter for supervision of other normal pregnancy, unspecified trimester: Secondary | ICD-10-CM

## 2020-03-06 DIAGNOSIS — O99013 Anemia complicating pregnancy, third trimester: Secondary | ICD-10-CM

## 2020-03-06 LAB — HEMOGLOBIN, FINGERSTICK: Hemoglobin: 10.7 g/dL — ABNORMAL LOW (ref 11.1–15.9)

## 2020-03-06 NOTE — Progress Notes (Signed)
   PRENATAL VISIT NOTE  Subjective:  Ana Ortiz is a 25 y.o. G2P1001 at [redacted]w[redacted]d being seen today for ongoing prenatal care.  She is currently monitored for the following issues for this low-risk pregnancy and has Late prenatal care affecting pregnancy, 14 6/7 wks; Supervision of other normal pregnancy, antepartum; and Anemia of mother in pregnancy, antepartum, third trimester on their problem list.  Patient reports very tired after working 48 hrs/wk 12 hour shifts.  Contractions: Not present. Vag. Bleeding: None.  Movement: Present. Denies leaking of fluid/ROM.   The following portions of the patient's history were reviewed and updated as appropriate: allergies, current medications, past family history, past medical history, past social history, past surgical history and problem list. Problem list updated.  Objective:   Vitals:   03/06/20 1106  BP: 115/65  Pulse: 85  Temp: 98.1 F (36.7 C)  Weight: 192 lb 9.6 oz (87.4 kg)    Fetal Status: Fetal Heart Rate (bpm): 160 Fundal Height: 31 cm Movement: Present     General:  Alert, oriented and cooperative. Patient is in no acute distress.  Skin: Skin is warm and dry. No rash noted.   Cardiovascular: Normal heart rate noted  Respiratory: Normal respiratory effort, no problems with respiration noted  Abdomen: Soft, gravid, appropriate for gestational age.  Pain/Pressure: Absent     Pelvic: Cervical exam deferred        Extremities: Normal range of motion.  Edema: None  Mental Status: Normal mood and affect. Normal behavior. Normal judgment and thought content.   Assessment and Plan:  Pregnancy: G2P1001 at [redacted]w[redacted]d  1. Anemia complicating pregnancy in third trimester Taking I FeSo4 daily with strawberry juice Hgb, folate and B12 drawn today - B12 and Folate Panel - Hemoglobin, venipuncture  2. Supervision of other normal pregnancy, antepartum Working 36-48 hrs/wk 12 hour shifts and tired with hips achy; note written to not work more than  40 hours/wk   Preterm labor symptoms and general obstetric precautions including but not limited to vaginal bleeding, contractions, leaking of fluid and fetal movement were reviewed in detail with the patient. Please refer to After Visit Summary for other counseling recommendations.  Return in about 2 weeks (around 03/20/2020) for routine PNC.  Future Appointments  Date Time Provider Department Center  03/20/2020 10:40 AM [facility] PROVIDER [facility] None    Alberteen Spindle, CNM

## 2020-03-06 NOTE — Progress Notes (Signed)
Correctly verbalizes how to take iron and PNV tablets. Takes iron with Vitamin C beverage (strawberry / kiwi juice). Jossie Ng, RN  Hgb = 10.7 and to continue daily iron. Jossie Ng, RN

## 2020-03-07 LAB — B12 AND FOLATE PANEL
Folate: >20 ng/mL (ref >3.0)
Vitamin B-12: 765 pg/mL (ref 232–1245)

## 2020-03-20 ENCOUNTER — Ambulatory Visit: Payer: Self-pay

## 2020-03-21 ENCOUNTER — Other Ambulatory Visit: Payer: Self-pay

## 2020-03-21 ENCOUNTER — Ambulatory Visit: Payer: No Typology Code available for payment source | Admitting: Advanced Practice Midwife

## 2020-03-21 VITALS — BP 116/67 | HR 89 | Temp 97.0°F | Wt 193.8 lb

## 2020-03-21 DIAGNOSIS — Z3480 Encounter for supervision of other normal pregnancy, unspecified trimester: Secondary | ICD-10-CM

## 2020-03-21 DIAGNOSIS — O99013 Anemia complicating pregnancy, third trimester: Secondary | ICD-10-CM

## 2020-03-21 NOTE — Progress Notes (Signed)
Presents for MH RV at 35.3 weeks. Take Iron and PNV daily. Denies ED/Hospital visit since last RV. Sharlyne Pacas, RN

## 2020-03-21 NOTE — Progress Notes (Signed)
   PRENATAL VISIT NOTE  Subjective:  Ana Ortiz is a 25 y.o. G2P1001 at [redacted]w[redacted]d being seen today for ongoing prenatal care.  She is currently monitored for the following issues for this low-risk pregnancy and has Late prenatal care affecting pregnancy, 14 6/7 wks; Supervision of other normal pregnancy, antepartum; and Anemia of mother in pregnancy, antepartum, third trimester on their problem list.  Patient reports no complaints.  Contractions: Not present. Vag. Bleeding: None.  Movement: Present. Denies leaking of fluid/ROM.   The following portions of the patient's history were reviewed and updated as appropriate: allergies, current medications, past family history, past medical history, past social history, past surgical history and problem list. Problem list updated.  Objective:   Vitals:   03/21/20 1340  BP: 116/67  Pulse: 89  Temp: (!) 97 F (36.1 C)  Weight: 193 lb 12.8 oz (87.9 kg)    Fetal Status: Fetal Heart Rate (bpm): 120 Fundal Height: 35 cm Movement: Present     General:  Alert, oriented and cooperative. Patient is in no acute distress.  Skin: Skin is warm and dry. No rash noted.   Cardiovascular: Normal heart rate noted  Respiratory: Normal respiratory effort, no problems with respiration noted  Abdomen: Soft, gravid, appropriate for gestational age.  Pain/Pressure: Absent     Pelvic: Cervical exam deferred        Extremities: Normal range of motion.  Edema: None  Mental Status: Normal mood and affect. Normal behavior. Normal judgment and thought content.   Assessment and Plan:  Pregnancy: G2P1001 at [redacted]w[redacted]d  1. Supervision of other normal pregnancy, antepartum Working 40 hrs/wk 36 lb 12.8 oz (16.7 kg)  Has car seat; baby shower this weekend Wants BTL   2. Anemia of mother in pregnancy, antepartum, third trimester Taking I FeSo4 daily with strawberry juice Folate and B12 03/02/20 wnl    Preterm labor symptoms and general obstetric precautions including but  not limited to vaginal bleeding, contractions, leaking of fluid and fetal movement were reviewed in detail with the patient. Please refer to After Visit Summary for other counseling recommendations.  No follow-ups on file.  No future appointments.  Alberteen Spindle, CNM

## 2020-03-28 ENCOUNTER — Ambulatory Visit: Payer: No Typology Code available for payment source | Admitting: Advanced Practice Midwife

## 2020-03-28 ENCOUNTER — Other Ambulatory Visit: Payer: Self-pay

## 2020-03-28 VITALS — BP 105/66 | HR 77 | Temp 98.0°F | Wt 196.2 lb

## 2020-03-28 DIAGNOSIS — O99013 Anemia complicating pregnancy, third trimester: Secondary | ICD-10-CM

## 2020-03-28 DIAGNOSIS — Z3483 Encounter for supervision of other normal pregnancy, third trimester: Secondary | ICD-10-CM

## 2020-03-28 DIAGNOSIS — Z3480 Encounter for supervision of other normal pregnancy, unspecified trimester: Secondary | ICD-10-CM

## 2020-03-28 NOTE — Progress Notes (Signed)
   PRENATAL VISIT NOTE  Subjective:  Ana Ortiz is a 25 y.o. G2P1001 at [redacted]w[redacted]d being seen today for ongoing prenatal care.  She is currently monitored for the following issues for this low-risk pregnancy and has Late prenatal care affecting pregnancy, 14 6/7 wks; Supervision of other normal pregnancy, antepartum; and Anemia of mother in pregnancy, antepartum, third trimester on their problem list.  Patient reports 30 sec pain in low back and crampy in low abdomen x4 today relieved spontaneously.  Contractions: Irritability. Vag. Bleeding: None.  Movement: Present. Denies leaking of fluid/ROM.   The following portions of the patient's history were reviewed and updated as appropriate: allergies, current medications, past family history, past medical history, past social history, past surgical history and problem list. Problem list updated.  Objective:   Vitals:   03/28/20 1340  BP: 105/66  Pulse: 77  Temp: 98 F (36.7 C)  Weight: 196 lb 3.2 oz (89 kg)    Fetal Status: Fetal Heart Rate (bpm): 120 Fundal Height: 36 cm Movement: Present  Presentation: Undeterminable  General:  Alert, oriented and cooperative. Patient is in no acute distress.  Skin: Skin is warm and dry. No rash noted.   Cardiovascular: Normal heart rate noted  Respiratory: Normal respiratory effort, no problems with respiration noted  Abdomen: Soft, gravid, appropriate for gestational age.  Pain/Pressure: Absent     Pelvic: Cervical exam performed Dilation: Closed Effacement (%): Thick Station: Ballotable  Extremities: Normal range of motion.  Edema: None  Mental Status: Normal mood and affect. Normal behavior. Normal judgment and thought content.   Assessment and Plan:  Pregnancy: G2P1001 at [redacted]w[redacted]d  1. Prenatal care, subsequent pregnancy, third trimester  - Chlamydia/GC NAA, Confirmation - GBS Culture  2. Supervision of other normal pregnancy, antepartum U/s ordered for ? Presentation 3 lb wt gain in last week;  had baby shower this weekend 39 lb 3.2 oz (17.8 kg) Knows when to go to L&D; has car seat Working 40 hours/wk GC/chlamydia and GBS cultures done  3. Anemia of mother in pregnancy, antepartum, third trimester Taking I FeSo4 daily with juice   Preterm labor symptoms and general obstetric precautions including but not limited to vaginal bleeding, contractions, leaking of fluid and fetal movement were reviewed in detail with the patient. Please refer to After Visit Summary for other counseling recommendations.  Return in about 1 week (around 04/04/2020) for routine PNC.  No future appointments.  Alberteen Spindle, CNM

## 2020-03-28 NOTE — Progress Notes (Addendum)
Correctly verbalizes how to take daily iron and PNV tablet. Declined self-collection of 36 week cultures. Jossie Ng, RN  [facility] referral for [place] (presention) faxed with snapshot pages and fax confirmation received. Jossie Ng, RN  Per [employer], [place] for presentation scheduled for 04/04/2020 at 0930 [facility]). Jossie Ng, RN

## 2020-03-30 LAB — CHLAMYDIA/GC NAA, CONFIRMATION
Chlamydia trachomatis, NAA: NEGATIVE
Neisseria gonorrhoeae, NAA: NEGATIVE

## 2020-04-01 LAB — CULTURE, BETA STREP (GROUP B ONLY): Strep Gp B Culture: NEGATIVE

## 2020-04-04 ENCOUNTER — Other Ambulatory Visit: Payer: Self-pay

## 2020-04-04 ENCOUNTER — Ambulatory Visit: Payer: No Typology Code available for payment source | Admitting: Advanced Practice Midwife

## 2020-04-04 VITALS — BP 121/72 | HR 93 | Wt 195.2 lb

## 2020-04-04 DIAGNOSIS — Z3480 Encounter for supervision of other normal pregnancy, unspecified trimester: Secondary | ICD-10-CM

## 2020-04-04 DIAGNOSIS — O99013 Anemia complicating pregnancy, third trimester: Secondary | ICD-10-CM

## 2020-04-04 LAB — URINALYSIS
Bilirubin, UA: NEGATIVE
Glucose, UA: NEGATIVE
Ketones, UA: NEGATIVE
Leukocytes,UA: NEGATIVE
Nitrite, UA: NEGATIVE
Protein,UA: NEGATIVE
RBC, UA: NEGATIVE
Specific Gravity, UA: 1.01 (ref 1.005–1.030)
Urobilinogen, Ur: 0.2 mg/dL (ref 0.2–1.0)
pH, UA: 6.5 (ref 5.0–7.5)

## 2020-04-04 LAB — HEMOGLOBIN, FINGERSTICK: Hemoglobin: 12.4 g/dL (ref 11.1–15.9)

## 2020-04-04 NOTE — Progress Notes (Signed)
   PRENATAL VISIT NOTE  Subjective:  Ana Ortiz is a 25 y.o. G2P1001 at [redacted]w[redacted]d being seen today for ongoing prenatal care.  She is currently monitored for the following issues for this low-risk pregnancy and has Late prenatal care affecting pregnancy, 14 6/7 wks; Supervision of other normal pregnancy, antepartum; and Anemia of mother in pregnancy, antepartum, third trimester on their problem list.  Patient reports no complaints.   .  .   . Denies leaking of fluid/ROM.   The following portions of the patient's history were reviewed and updated as appropriate: allergies, current medications, past family history, past medical history, past social history, past surgical history and problem list. Problem list updated.  Objective:   Vitals:   04/04/20 1348  BP: 121/72  Pulse: 93  Weight: 195 lb 3.2 oz (88.5 kg)    Fetal Status:           General:  Alert, oriented and cooperative. Patient is in no acute distress.  Skin: Skin is warm and dry. No rash noted.   Cardiovascular: Normal heart rate noted  Respiratory: Normal respiratory effort, no problems with respiration noted  Abdomen: Soft, gravid, appropriate for gestational age.        Pelvic: Cervical exam deferred        Extremities: Normal range of motion.     Mental Status: Normal mood and affect. Normal behavior. Normal judgment and thought content.   Assessment and Plan:  Pregnancy: G2P1001 at [redacted]w[redacted]d  1. Supervision of other normal pregnancy, antepartum Knows when to go to L&D Working 40 hrs/wk and wants to stop working 04/06/20; requesting FMLA paperwork to be completed--done. Counseled pt she can still work until she goes into labor U/s today with AFI wnl, growth wnl, vtx BP sl elevated above pt's baseline so u/a ordered today--neg protein - Urinalysis (Urine Dip)  2. Anemia of mother in pregnancy, antepartum, third trimester Taking I FeSo4 daily with oj; states having 3-4 BM's /day since beginning FeSo4 - Hemoglobin,  fingerstick   Preterm labor symptoms and general obstetric precautions including but not limited to vaginal bleeding, contractions, leaking of fluid and fetal movement were reviewed in detail with the patient. Please refer to After Visit Summary for other counseling recommendations.  Return in about 1 week (around 04/11/2020) for routine PNC.  No future appointments.  Alberteen Spindle, CNM

## 2020-04-04 NOTE — Progress Notes (Signed)
Presents for MH RV at 37.3 wks. Takes PNV and iron daily. Sharlyne Pacas, RN

## 2020-04-11 ENCOUNTER — Ambulatory Visit: Payer: No Typology Code available for payment source | Admitting: Family Medicine

## 2020-04-11 ENCOUNTER — Other Ambulatory Visit: Payer: Self-pay

## 2020-04-11 VITALS — BP 120/72 | HR 84 | Temp 98.0°F | Wt 195.0 lb

## 2020-04-11 DIAGNOSIS — Z3480 Encounter for supervision of other normal pregnancy, unspecified trimester: Secondary | ICD-10-CM

## 2020-04-11 DIAGNOSIS — O99013 Anemia complicating pregnancy, third trimester: Secondary | ICD-10-CM

## 2020-04-11 DIAGNOSIS — O0930 Supervision of pregnancy with insufficient antenatal care, unspecified trimester: Secondary | ICD-10-CM

## 2020-04-11 MED ORDER — IRON (FERROUS SULFATE) 325 (65 FE) MG PO TABS: 325.0000 mg | ORAL_TABLET | Freq: Every day | ORAL | 0 refills | Status: AC

## 2020-04-11 NOTE — Progress Notes (Signed)
PRENATAL VISIT NOTE  Subjective:  Ana Ortiz is a 25 y.o. G2P1001 at [redacted]w[redacted]d being seen today for ongoing prenatal care.  She is currently monitored for the following issues for this low-risk pregnancy and has Late prenatal care affecting pregnancy, 14 6/7 wks; Supervision of other normal pregnancy, antepartum; and Anemia of mother in pregnancy, antepartum, third trimester on their problem list.  Patient reports no complaints.  Contractions: Not present. Vag. Bleeding: None.  Movement: Present. Denies leaking of fluid/ROM.   The following portions of the patient's history were reviewed and updated as appropriate: allergies, current medications, past family history, past medical history, past social history, past surgical history and problem list. Problem list updated.  Objective:   Vitals:   04/11/20 1359  BP: 120/72  Pulse: 84  Temp: 98 F (36.7 C)  Weight: 195 lb (88.5 kg)    Fetal Status: Fetal Heart Rate (bpm): 140 Fundal Height: 38 cm Movement: Present  Presentation: Vertex  General:  Alert, oriented and cooperative. Patient is in no acute distress.  Skin: Skin is warm and dry. No rash noted.   Cardiovascular: Normal heart rate noted  Respiratory: Normal respiratory effort, no problems with respiration noted  Abdomen: Soft, gravid, appropriate for gestational age.  Pain/Pressure: Absent     Pelvic: Cervical exam deferred        Extremities: Normal range of motion.  Edema: None  Mental Status: Normal mood and affect. Normal behavior. Normal judgment and thought content.   Assessment and Plan:  Pregnancy: G2P1001 at [redacted]w[redacted]d  1. Supervision of other normal pregnancy, antepartum Up to date Confirmed desire for BTL Reviewed when to got to LD We discussed IOL at 41 weeks with planning for this at next visit  2. Late prenatal care affecting pregnancy, 14 6/7 wks Up to date  3. Anemia of mother in pregnancy, antepartum, third trimester Taking Fe hgb is improved per last check  3/8 was 12.4   Term labor symptoms and general obstetric precautions including but not limited to vaginal bleeding, contractions, leaking of fluid and fetal movement were reviewed in detail with the patient. Please refer to After Visit Summary for other counseling recommendations.   Return in about 1 week (around 04/18/2020) for Routine prenatal care.  Future Appointments  Date Time Provider Department Center  04/18/2020 11:00 AM [facility] PROVIDER [facility] None    Federico Flake, MD

## 2020-04-13 ENCOUNTER — Encounter: Payer: Self-pay | Admitting: Physician Assistant

## 2020-04-13 NOTE — Progress Notes (Signed)
Completed provider portion of FMLA paperwork for anticipated 6-week period of postpartum leave for recovery from childbirth and care of newborn infant. Pt to sign ROI and complete her portion at RV sched for 04/18/20.

## 2020-04-14 ENCOUNTER — Other Ambulatory Visit: Payer: Self-pay

## 2020-04-14 DIAGNOSIS — O99013 Anemia complicating pregnancy, third trimester: Secondary | ICD-10-CM

## 2020-04-17 ENCOUNTER — Other Ambulatory Visit: Payer: Self-pay

## 2020-04-17 DIAGNOSIS — O99013 Anemia complicating pregnancy, third trimester: Secondary | ICD-10-CM

## 2020-04-18 ENCOUNTER — Other Ambulatory Visit: Payer: Self-pay

## 2020-04-18 ENCOUNTER — Ambulatory Visit: Payer: No Typology Code available for payment source | Admitting: Family Medicine

## 2020-04-18 VITALS — BP 122/63 | HR 85 | Temp 97.8°F | Wt 199.6 lb

## 2020-04-18 DIAGNOSIS — Z3480 Encounter for supervision of other normal pregnancy, unspecified trimester: Secondary | ICD-10-CM

## 2020-04-18 DIAGNOSIS — O99013 Anemia complicating pregnancy, third trimester: Secondary | ICD-10-CM

## 2020-04-18 NOTE — Progress Notes (Signed)
Here today for 39.3 week MH RV. Taking PNV and Iron QD. Denies ED/hospital visits since last RV. FMLA paperwork copied and placed in chart and original given to patient. Tawny Hopping, RN

## 2020-04-18 NOTE — Progress Notes (Signed)
   PRENATAL VISIT NOTE  Subjective:  Ana Ortiz is a 25 y.o. G2P1001 at [redacted]w[redacted]d being seen today for ongoing prenatal care.  She is currently monitored for the following issues for this low-risk pregnancy and has Late prenatal care affecting pregnancy, 14 6/7 wks; Supervision of other normal pregnancy, antepartum; and Anemia of mother in pregnancy, antepartum, third trimester on their problem list.  Patient reports fatigue.  Contractions: Irritability. Vag. Bleeding: None.  Movement: Present. Denies leaking of fluid/ROM.   The following portions of the patient's history were reviewed and updated as appropriate: allergies, current medications, past family history, past medical history, past social history, past surgical history and problem list. Problem list updated.  Objective:   Vitals:   04/18/20 1113  BP: 122/63  Pulse: 85  Temp: 97.8 F (36.6 C)  Weight: 199 lb 9.6 oz (90.5 kg)    Fetal Status: Fetal Heart Rate (bpm): 143 Fundal Height: 38 cm Movement: Present  Presentation: Vertex  General:  Alert, oriented and cooperative. Patient is in no acute distress.  Skin: Skin is warm and dry. No rash noted.   Cardiovascular: Normal heart rate noted  Respiratory: Normal respiratory effort, no problems with respiration noted  Abdomen: Soft, gravid, appropriate for gestational age.  Pain/Pressure: Absent     Pelvic: Cervical exam performed Dilation: Closed Effacement (%): Thick Station: -1  Extremities: Normal range of motion.  Edema: None  Mental Status: Normal mood and affect. Normal behavior. Normal judgment and thought content.   Assessment and Plan:  Pregnancy: G2P1001 at [redacted]w[redacted]d  1. Supervision of other normal pregnancy, antepartum -IOL form completed today, date 4/4 or 4/5 chosen which is at [redacted]w[redacted]d or [redacted]w[redacted]d  gestation. Cervical exam: 0 dilation, thick effacement, -1 station.  Discussed membrane sweeping today. Reviewed cochrane review data on membrane sweeping at 39 wks and then at  EDD. Reviewed risk of cramping, contractions, bleeding and ROM. Answered patient questions and pt agreed  to proceed with procedure. Membrane sweep unsuccessful.  -Reviewed how to get in touch with hospital, discussed when to call.   -Reviewed breastfeeding support at [facility] and in [facility] clinic. Encouraged pt to sign up for [facility].  -Advised to schedule postpartum visit 6 wks after delivery or sooner if needed.  Baby ready-  patient has car seat seat, and sleeping arrangement are complete.     2. Anemia complicating pregnancy in third trimester Patient is taking FeSO4 as directed.    Term labor symptoms and general obstetric precautions including but not limited to vaginal bleeding, contractions, leaking of fluid and fetal movement were reviewed in detail with the patient. Please refer to After Visit Summary for other counseling recommendations.  Return in about 1 week (around 04/25/2020) for routine prenatal care.  Future Appointments  Date Time Provider Department Center  04/25/2020  1:20 PM [facility] PROVIDER [facility] None    Wendi Snipes, FNP

## 2020-04-18 NOTE — Progress Notes (Signed)
IOL referral faxed to [facility]. LM for Patrice to call back to schedule IOL. Tawny Hopping, RN

## 2020-04-19 ENCOUNTER — Telehealth: Payer: Self-pay

## 2020-04-19 NOTE — Telephone Encounter (Signed)
TC to patient to inform of [facility] IOL on 04/29/2020. Patient to expect call from [facility] between 11am--2pm to let her know what time to arrive. Patient states understanding. Patient also states she received an email from [facility] saying they would call to make her a covid testing appointment about 2 days before her IOL appt. Patient counseled to make sure to answer her phone when [facility] calls.  Patient aware of next appointment at [facility].Marland KitchenBurt Knack, RN

## 2020-04-25 ENCOUNTER — Other Ambulatory Visit: Payer: Self-pay

## 2020-04-25 ENCOUNTER — Ambulatory Visit: Payer: Self-pay

## 2020-04-25 ENCOUNTER — Ambulatory Visit: Payer: No Typology Code available for payment source | Admitting: Advanced Practice Midwife

## 2020-04-25 VITALS — BP 121/70 | HR 86 | Temp 97.8°F | Wt 201.0 lb

## 2020-04-25 DIAGNOSIS — O0930 Supervision of pregnancy with insufficient antenatal care, unspecified trimester: Secondary | ICD-10-CM

## 2020-04-25 DIAGNOSIS — O99013 Anemia complicating pregnancy, third trimester: Secondary | ICD-10-CM

## 2020-04-25 DIAGNOSIS — Z3480 Encounter for supervision of other normal pregnancy, unspecified trimester: Secondary | ICD-10-CM

## 2020-04-25 NOTE — Progress Notes (Signed)
Patient is aware of IOL apt on 04/29/20. Patient still waiting on a call to verify time.

## 2020-04-25 NOTE — Progress Notes (Signed)
   PRENATAL VISIT NOTE  Subjective:  Ana Ortiz is a 25 y.o. G2P1001 at [redacted]w[redacted]d being seen today for ongoing prenatal care.  She is currently monitored for the following issues for this low-risk pregnancy and has Late prenatal care affecting pregnancy, 14 6/7 wks; Supervision of other normal pregnancy, antepartum; and Anemia of mother in pregnancy, antepartum, third trimester on their problem list.  Patient reports no complaints.  Contractions: Not present. Vag. Bleeding: None.  Movement: Present. Denies leaking of fluid/ROM.   The following portions of the patient's history were reviewed and updated as appropriate: allergies, current medications, past family history, past medical history, past social history, past surgical history and problem list. Problem list updated.  Objective:   Vitals:   04/25/20 1322  BP: 121/70  Pulse: 86  Temp: 97.8 F (36.6 C)  Weight: 201 lb (91.2 kg)    Fetal Status: Fetal Heart Rate (bpm): 150 Fundal Height: 38 cm Movement: Present  Presentation: Vertex  General:  Alert, oriented and cooperative. Patient is in no acute distress.  Skin: Skin is warm and dry. No rash noted.   Cardiovascular: Normal heart rate noted  Respiratory: Normal respiratory effort, no problems with respiration noted  Abdomen: Soft, gravid, appropriate for gestational age.  Pain/Pressure: Absent     Pelvic: Cervical exam performed Dilation: 1 Effacement (%): 60 Station: -3  Extremities: Normal range of motion.  Edema: None  Mental Status: Normal mood and affect. Normal behavior. Normal judgment and thought content.   Assessment and Plan:  Pregnancy: G2P1001 at [redacted]w[redacted]d  1. Supervision of other normal pregnancy, antepartum Knows when to go to L&D.  Ready for baby at home IOL 04/29/20.  Reviewed 04/04/20 u/s at 36 4/7 with AFI wnl, growth wnl, EFW 38%, anterior placenta, 6#11 44 lb (20 kg)  Pt agrees to membrane stripping--done   2. Late prenatal care affecting pregnancy, 14 6/7  wks   3. Anemia of mother in pregnancy, antepartum, third trimester Taking I FeSo4 daily with strawberry juice   Term labor symptoms and general obstetric precautions including but not limited to vaginal bleeding, contractions, leaking of fluid and fetal movement were reviewed in detail with the patient. Please refer to After Visit Summary for other counseling recommendations.  No follow-ups on file.  No future appointments.  Alberteen Spindle, CNM

## 2020-04-26 DIAGNOSIS — Z3403 Encounter for supervision of normal first pregnancy, third trimester: Secondary | ICD-10-CM

## 2020-06-08 ENCOUNTER — Ambulatory Visit: Payer: No Typology Code available for payment source | Admitting: Physician Assistant

## 2020-06-08 ENCOUNTER — Other Ambulatory Visit: Payer: Self-pay

## 2020-06-08 ENCOUNTER — Ambulatory Visit: Payer: Medicaid Other

## 2020-06-08 DIAGNOSIS — Z3009 Encounter for other general counseling and advice on contraception: Secondary | ICD-10-CM

## 2020-06-08 NOTE — Progress Notes (Signed)
Client signed in at 1321 and arrived at 22. Due to the provider at 1320. Client counseled that due to arrival time would now be considered a work-in. Client offered to reschedule and post-partum scheduled for 06/13/2020 (client s/p BTL 03/2020). Jossie Ng, RN

## 2020-06-13 ENCOUNTER — Ambulatory Visit: Payer: No Typology Code available for payment source | Admitting: Advanced Practice Midwife

## 2020-06-13 ENCOUNTER — Encounter: Payer: Self-pay | Admitting: Advanced Practice Midwife

## 2020-06-13 ENCOUNTER — Other Ambulatory Visit: Payer: Self-pay

## 2020-06-13 DIAGNOSIS — Z9851 Tubal ligation status: Secondary | ICD-10-CM

## 2020-06-13 LAB — HEMOGLOBIN, FINGERSTICK: Hemoglobin: 12.1 g/dL (ref 11.1–15.9)

## 2020-06-13 NOTE — Progress Notes (Signed)
Here today for a 6.6 week PP exam. SVD of female infant at [facility] on 04/26/2020 at 40.[redacted] weeks gestation. Patient states that baby weighed 7 lbs 14.6 ozs. Had PP BTL 04/27/2020. Last Pap Smear was 10/27/2017 (negative.) Declines all STD screening today. Tawny Hopping, RN

## 2020-06-13 NOTE — Progress Notes (Signed)
Post Partum Exam  Ana Ortiz is a 25 y.o.MHF [id number] (2 1/2 yo daughter and baby) nonsmoker female who presents for a postpartum visit. She is 6 weeks postpartum following a spontaneous vaginal delivery on 04/26/20 F 7#14.  I have fully reviewed the prenatal and intrapartum course. The delivery was at 40 4/7 gestational weeks.  Anesthesia: epidural. Postpartum course has been wnl. Baby's course has been wnl. Baby is feeding by breast and bottle.  Breastfeeding q 3 hours and formula 3-4 oz.  Baby weighed 9#4 on 05/30/20.  States pp sex x 2. Denies cigs, MJ.  Last ETOH 2 wks ago (4 Teequila shots). Husband helped her with the kids for the first 4 weeks. Bleeding no bleeding. Bowel function is normal. Bladder function is normal. Patient is sexually active. Contraception method is BTL on 04/27/20.   Postpartum depression screening:  Edinburgh Postnatal Depression Scale - 06/13/20 1518      Edinburgh Postnatal Depression Scale:  In the Past 7 Days   I have been able to laugh and see the funny side of things. 0    I have looked forward with enjoyment to things. 0    I have blamed myself unnecessarily when things went wrong. 1    I have been anxious or worried for no good reason. 1    I have felt scared or panicky for no good reason. 0    Things have been getting on top of me. 1    I have been so unhappy that I have had difficulty sleeping. 0    I have felt sad or miserable. 0    I have been so unhappy that I have been crying. 0    The thought of harming myself has occurred to me. 0    Edinburgh Postnatal Depression Scale Total 3            The following portions of the patient's history were reviewed and updated as appropriate: allergies, current medications, past family history, past medical history, past social history, past surgical history and problem list. Last pap smear done 10/27/17 and was Normal  Review of Systems Pertinent items are noted in HPI.    Objective:  BP 126/83   Ht 5'  5" (1.651 m)   Wt 186 lb 6.4 oz (84.6 kg)   Breastfeeding Yes   BMI 31.02 kg/m   Gen: well appearing, NAD HEENT: no scleral icterus CV: RR Lung: Normal WOB Breast:performed-not indicated as denies redness, bleeding, lumps Ext: warm well perfused Abdomen soft without tenderness Vagina pink, intact GU: Uterus NSSC Rectal: performed -  not indicated       Assessment:    6.6 wk postpartum exam. Pap smear not done at today's visit.   Plan:   Essential components of care per ACOG recommendations for Comprehensive Postpartum exam:  1.  Mood and well being: Patient with negative depression screening today. Reviewed local resources for support. EPDS is low risk. Reviewed resources and that mood sx in first year after pregnancy are considered related to pregnancy and to reach out for help at [facility] if needed. Discussed [facility] as link to care and availability of LCSW for counseling  - Patient does not use tobacco.  - hx of drug use? No    2. Infant care and feeding:  -Patient currently breastmilk feeding? Yes If breastmilk feeding discussed return to work and pumping. If needed, patient was provided letter for work to allow for every 2-3 hr pumping breaks, and to  be granted a private location to express breastmilk and refrigerated area to store breastmilk. Reviewed importance of draining breast regularly to support lactation. I  -Recommended patient engage with WIC/BFpeer counselors  -Counseled to sign new child up for [facility] services -Social determinants of health (SDOH) reviewed in EPIC. No concerns  3. Sexuality, contraception and birth spacing  Contraception: Contraception counseling: Reviewed all forms of birth control options in the tiered based approach. available including abstinence; over the counter/barrier methods; hormonal contraceptive medication including pill, patch, ring, injection,contraceptive implant; hormonal and nonhormonal IUDs; permanent sterilization options including  vasectomy and the various tubal sterilization modalities. Risks, benefits, and typical effectiveness rates were reviewed.  Questions were answered.  Written information was also given to the patient to review.  Patient desires BTL, this was prescribed for patient. She will follow up in  prn for surveillance.  She was told to call with any further questions, or with any concerns about this method of contraception.  Emphasized use of condoms 100% of the time for STI prevention.  Patient was offered ECP. ECP was not accepted by the patient. ECP counseling was not given - see RN documentation  - Patient does not want a pregnancy in the next year.  Desired family size is 2 children.  - Reviewed forms of contraception in tiered fashion. Patient desired BTL today.   - Discussed birth spacing of 18 months  4. Sleep and fatigue -Encouraged family/partner/community support of 4 hrs of uninterrupted sleep to help with mood and fatigue  5. Physical Recovery  - Discussed patients delivery - Patient had a 0 degree laceration, perineal healing reviewed. Patient expressed understanding - Patient has urinary incontinence? No - Patient is safe to resume physical and sexual activity  6.  Health Maintenance/Chronic Disease Health Maintenance Due  Topic Date Due  . COVID-19 Vaccine (1) Never done  . HPV VACCINES (1 - 2-dose series) Never done    - Last pap smear performed 10/27/17 and was normal  1. History of bilateral tubal ligation 04/27/20  2. Postpartum exam   - Hemoglobin, venipuncture   Patient given handout about PCP care in the community Given MVI per family planning program guidelines and availability  Follow up in: prn prn or as needed.

## 2020-06-13 NOTE — Progress Notes (Signed)
Hgb 12.1. Per standing order no treatment indicated. Instructed to continue taking PNV as long as breastfeeding continues. Tawny Hopping, RN

## 2020-08-22 ENCOUNTER — Telehealth: Payer: Self-pay | Admitting: Family Medicine

## 2020-08-22 NOTE — Telephone Encounter (Signed)
Encounter closed as previously opened phone encounter for same purpose. Jossie Ng, RN

## 2020-08-22 NOTE — Telephone Encounter (Signed)
Patient called to ask about the form she needs so she is able to return to work.

## 2020-08-22 NOTE — Telephone Encounter (Signed)
Call to client regarding return to work forms. Per client, lady she spoke with asked her to fax forms. Per client, she called [phone] and faxed forms to [phone]. Counseled that forms are not in [facility] and RN will check with Dian Queen in [employer] tomorrow as those are her numbers. Jossie Ng, RN

## 2020-08-22 NOTE — Telephone Encounter (Signed)
I need a letter to go back to work.

## 2020-08-23 NOTE — Telephone Encounter (Signed)
Encounter received from Dian Queen this am as she brought to clinic. Form completed bvy E. Sciora CNM and faxed to number provided with confirmation received. Copy sent for scanning and original mailed to client. Client notified of above via phone call this am. Jossie Ng, RN

## 2023-07-15 ENCOUNTER — Ambulatory Visit (INDEPENDENT_AMBULATORY_CARE_PROVIDER_SITE_OTHER): Admitting: Licensed Practical Nurse

## 2023-07-15 ENCOUNTER — Other Ambulatory Visit (HOSPITAL_COMMUNITY)
Admission: RE | Admit: 2023-07-15 | Discharge: 2023-07-15 | Disposition: A | Discharge status: Home / Self Care | Source: Ambulatory Visit | Attending: Licensed Practical Nurse | Admitting: Licensed Practical Nurse

## 2023-07-15 ENCOUNTER — Encounter: Payer: Self-pay | Admitting: Licensed Practical Nurse

## 2023-07-15 VITALS — BP 118/81 | HR 87 | Ht 65.0 in | Wt 158.7 lb

## 2023-07-15 DIAGNOSIS — Z566 Other physical and mental strain related to work: Secondary | ICD-10-CM

## 2023-07-15 DIAGNOSIS — Z113 Encounter for screening for infections with a predominantly sexual mode of transmission: Secondary | ICD-10-CM

## 2023-07-15 DIAGNOSIS — Z124 Encounter for screening for malignant neoplasm of cervix: Secondary | ICD-10-CM | POA: Insufficient documentation

## 2023-07-15 DIAGNOSIS — Z01419 Encounter for gynecological examination (general) (routine) without abnormal findings: Secondary | ICD-10-CM | POA: Insufficient documentation

## 2023-07-15 NOTE — Progress Notes (Addendum)
 Patient, No Pcp Per   Chief Complaint  Patient presents with   Gynecologic Exam    HPI:      Saysha Ortiz is a 28 y.o. [id number] whose LMP was Patient's last menstrual period was 07/15/2023., presents today for annual exam. Reports cycle in may went longer than her usual, 40 days. Reports her cycles are usually every 30 days, for 5 days. Reports an increase in stress levels, stating this job is going to kill me and describing her stress as a 7 out of 10, 10 being the highest. Currently does nothing to manage stress. Had a BTL in 2022. Declines STI testing today.   - Most recent PCP visit was in December of 2024 and scheduled to go next week.  - Had an eye exam in April 2025, wears contacts - Last dental visit was in march 2025.  - Seeing PCP for weight loss management, started on phentamine 2 months ago, has not seen any results yet.  - Seeing PCP for hair loss management.  - Reports exercising daily for 1 hour on the threadmill  - Follows Keto diet  - denies anxiety and depression  - Denies smoking, vaping, nicotine, tobacco and drug use.  - Drinks 10 bottles of water a day - Married and lives with husband and 2 daughters. Feels safe. No history of abuse.  - Reports family hx of CVD and Diabetes  - Denies family hx of breast cancer  -      Patient Active Problem List   Diagnosis Date Noted   History of bilateral tubal ligation 04/27/20 06/13/2020    Past Surgical History:  Procedure Laterality Date   Denies surgical history     NO PAST SURGERIES      Family History  Problem Relation Age of Onset   Diabetes Mother    Hypertension Mother    Migraines Mother    Thyroid disease Mother    Hypertension Father    Anemia Maternal Grandmother    Hypertension Maternal Grandmother    Heart disease Maternal Grandmother    Lung disease Maternal Grandmother    Diabetes Maternal Grandmother    Cancer Maternal Grandfather    Diabetes Paternal Grandmother    Birth defects  Niece    Autism Niece    Urinary tract infection Daughter     Social History   Socioeconomic History   Marital status: Married    Spouse name: Venia Gilles   Number of children: 2   Years of education: 12   Highest education level: 12th grade  Occupational History   Occupation: Armed forces training and education officer    Comment: Scientist, clinical (histocompatibility and immunogenetics) for deterg[employer]  Tobacco Use   Smoking status: Former   Smokeless tobacco: Never  Vaping Use   Vaping status: Never Used  Substance and Sexual Activity   Alcohol use: Not Currently    Alcohol/week: 5.0 standard drinks of alcohol    Types: 5 Shots of liquor per week    Comment: last use 08/20/19   Drug use: Never   Sexual activity: Yes    Partners: Male    Birth control/protection: Surgical    Comment: BTL 04/27/2020  Other Topics Concern   Not on file  Social History Narrative   Not on file   Social Drivers of Health   Financial Resource Strain: High Risk (09/29/2019)   Overall Financial Resource Strain (CARDIA)    Difficulty of Paying Living Expenses: Hard  Food Insecurity: Food Insecurity Present (09/29/2019)  Hunger Vital Sign    Worried About Running Out of Food in the Last Year: Sometimes true    Ran Out of Food in the Last Year: Sometimes true  Transportation Needs: No Transportation Needs (09/29/2019)   PRAPARE - Administrator, Civil Service (Medical): No    Lack of Transportation (Non-Medical): No  Physical Activity: Not on file  Stress: Not on file  Social Connections: Not on file  Intimate Partner Violence: Not At Risk (06/13/2020)   Humiliation, Afraid, Rape, and Kick questionnaire    Fear of Current or Ex-Partner: No    Emotionally Abused: No    Physically Abused: No    Sexually Abused: No    Outpatient Medications Prior to Visit  Medication Sig Dispense Refill   cholecalciferol (VITAMIN D3) 25 MCG (1000 UNIT) tablet Take 1,000 Units by mouth daily.     MULTIPLE VITAMIN PO Take by  mouth.     Iron , Ferrous Sulfate , 325 (65 Fe) MG TABS Take 325 mg by mouth daily. (Patient not taking: Reported on 07/15/2023) 100 tablet 0   Prenatal Vit-Fe Fumarate-FA (PRENATAL VITAMIN) 27-0.8 MG TABS Take 1 tablet by mouth daily. (Patient not taking: Reported on 07/15/2023) 30 tablet 11   No facility-administered medications prior to visit.      ROS:  Review of Systems  Constitutional: Negative.   HENT: Negative.    Cardiovascular: Negative.   Gastrointestinal: Negative.   Endocrine:       Hair loss   Genitourinary: Negative.   Musculoskeletal:        Swelling in right side of neck when stressed   Skin: Negative.   Allergic/Immunologic: Negative.   Neurological: Negative.   Hematological: Negative.   Psychiatric/Behavioral:         High stress      OBJECTIVE:   Vitals:  BP 118/81 (BP Location: Left Arm, Patient Position: Sitting, Cuff Size: Normal)   Pulse 87   Ht 5' 5 (1.651 m)   Wt 72 kg   LMP 07/15/2023   BMI 26.41 kg/m   Physical Exam Constitutional:      Appearance: Normal appearance.  HENT:     Head: Normocephalic.     Nose: Nose normal.     Mouth/Throat:     Mouth: Mucous membranes are dry.   Eyes:     Pupils: Pupils are equal, round, and reactive to light.    Cardiovascular:     Rate and Rhythm: Normal rate and regular rhythm.  Pulmonary:     Effort: Pulmonary effort is normal.     Breath sounds: Normal breath sounds.  Abdominal:     General: Abdomen is flat.     Palpations: Abdomen is soft.  Genitourinary:    General: Normal vulva.     Rectum: Normal.     Comments: Bimanual exam performed, uterus uniform. No masses felt. No pain with exam.   Musculoskeletal:        General: Normal range of motion.     Cervical back: Normal range of motion.   Skin:    General: Skin is warm and dry.   Neurological:     Mental Status: She is alert and oriented to person, place, and time.   Psychiatric:        Mood and Affect: Mood normal.         Behavior: Behavior normal.     Results: No results found for this or any previous visit (from the past 24 hours).  Assessment/Plan: Stress at work - Plan: Ambulatory referral to Psychology  Well woman exam - Plan: Cytology - PAP  Cervical cancer screening - Plan: Cytology - PAP  Screening examination for venereal disease - Plan: Cytology - PAP    No orders of the defined types were placed in this encounter.    Raina Bunting, Student-MidWife 07/15/2023 4:39 PM

## 2023-07-21 LAB — CYTOLOGY - PAP
Chlamydia: NEGATIVE
Comment: NEGATIVE
Comment: NORMAL
Diagnosis: NEGATIVE
Neisseria Gonorrhea: NEGATIVE
